# Patient Record
Sex: Female | Born: 1980
Health system: Southern US, Community
[De-identification: ages and names within clinical notes are randomized; demographics above are authoritative.]

## PROBLEM LIST (undated history)

## (undated) DIAGNOSIS — M797 Fibromyalgia: Secondary | ICD-10-CM

## (undated) DIAGNOSIS — M1990 Unspecified osteoarthritis, unspecified site: Secondary | ICD-10-CM

## (undated) DIAGNOSIS — F419 Anxiety disorder, unspecified: Secondary | ICD-10-CM

## (undated) DIAGNOSIS — D649 Anemia, unspecified: Secondary | ICD-10-CM

## (undated) DIAGNOSIS — R519 Headache, unspecified: Secondary | ICD-10-CM

## (undated) HISTORY — DX: Anemia, unspecified: D64.9

## (undated) HISTORY — PX: KNEE ARTHROSCOPY: SUR90

## (undated) HISTORY — DX: Unspecified osteoarthritis, unspecified site: M19.90

## (undated) HISTORY — DX: Headache, unspecified: R51.9

---
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
## 2008-04-28 HISTORY — PX: GASTRIC BYPASS: SHX52

## 2008-04-28 HISTORY — PX: CHOLECYSTECTOMY: SHX55

## 2015-06-19 ENCOUNTER — Encounter: Payer: Self-pay | Admitting: Psychiatry

## 2015-06-19 DIAGNOSIS — F22 Delusional disorders: Secondary | ICD-10-CM | POA: Diagnosis present
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
## 2015-06-19 NOTE — BH Assessment (Signed)
Patient has been accepted to [facility].  Patient was referred by [facility] [phone]) Accepting physician is Dr. Wynn Banker.  Attending Physician will be Dr. Jennet Maduro.  Patient has been assigned to room 315, by [facility] Charge Nurse [place].  Call report to [phone].  Representative/Transfer Coordinator is Judeth Cornfield  Patient expected to arrive Wednesday, 06/20/2015 after 7am

## 2015-06-20 ENCOUNTER — Inpatient Hospital Stay
Admission: EM | Admit: 2015-06-20 | Discharge: 2015-06-20 | DRG: 885 | Disposition: A | Discharge status: Home / Self Care | Payer: No Typology Code available for payment source | Source: Other Acute Inpatient Hospital | Attending: Psychiatry | Admitting: Psychiatry

## 2015-06-20 DIAGNOSIS — M797 Fibromyalgia: Secondary | ICD-10-CM | POA: Diagnosis present

## 2015-06-20 DIAGNOSIS — F909 Attention-deficit hyperactivity disorder, unspecified type: Secondary | ICD-10-CM | POA: Diagnosis present

## 2015-06-20 DIAGNOSIS — F22 Delusional disorders: Secondary | ICD-10-CM | POA: Diagnosis present

## 2015-06-20 DIAGNOSIS — F411 Generalized anxiety disorder: Secondary | ICD-10-CM | POA: Diagnosis present

## 2015-06-20 DIAGNOSIS — Z880 Allergy status to penicillin: Secondary | ICD-10-CM | POA: Diagnosis not present

## 2015-06-20 HISTORY — DX: Fibromyalgia: M79.7

## 2015-06-20 HISTORY — DX: Anxiety disorder, unspecified: F41.9

## 2015-06-20 LAB — URINALYSIS COMPLETE WITH MICROSCOPIC (ARMC ONLY)
BILIRUBIN URINE: NEGATIVE
Bacteria, UA: NONE SEEN
GLUCOSE, UA: NEGATIVE mg/dL
HGB URINE DIPSTICK: NEGATIVE
Leukocytes, UA: NEGATIVE
Nitrite: NEGATIVE
PH: 6 (ref 5.0–8.0)
PROTEIN: NEGATIVE mg/dL
SPECIFIC GRAVITY, URINE: 1.021 (ref 1.005–1.030)

## 2015-06-20 LAB — COMPREHENSIVE METABOLIC PANEL
ALBUMIN: 4.1 g/dL (ref 3.5–5.0)
ALT: 113 U/L — AB (ref 14–54)
AST: 56 U/L — AB (ref 15–41)
Alkaline Phosphatase: 123 U/L (ref 38–126)
Anion gap: 12 (ref 5–15)
BUN: 12 mg/dL (ref 6–20)
CHLORIDE: 109 mmol/L (ref 101–111)
CO2: 21 mmol/L — AB (ref 22–32)
CREATININE: 0.58 mg/dL (ref 0.44–1.00)
Calcium: 9.4 mg/dL (ref 8.9–10.3)
GFR calc Af Amer: >60 mL/min (ref >60)
GFR calc non Af Amer: >60 mL/min (ref >60)
Glucose, Bld: 104 mg/dL — ABNORMAL HIGH (ref 65–99)
POTASSIUM: 3.8 mmol/L (ref 3.5–5.1)
SODIUM: 142 mmol/L (ref 135–145)
Total Bilirubin: 0.6 mg/dL (ref 0.3–1.2)
Total Protein: 7.7 g/dL (ref 6.5–8.1)

## 2015-06-20 LAB — CBC
HCT: 37.4 % (ref 35.0–47.0)
Hemoglobin: 12.3 g/dL (ref 12.0–16.0)
MCH: 27 pg (ref 26.0–34.0)
MCHC: 32.9 g/dL (ref 32.0–36.0)
MCV: 82 fL (ref 80.0–100.0)
PLATELETS: 320 10*3/uL (ref 150–440)
RBC: 4.56 MIL/uL (ref 3.80–5.20)
RDW: 13.8 % (ref 11.5–14.5)
WBC: 9.2 10*3/uL (ref 3.6–11.0)

## 2015-06-20 LAB — LIPID PANEL
CHOL/HDL RATIO: 2.6 ratio
Cholesterol: 166 mg/dL (ref 0–200)
HDL: 65 mg/dL (ref >40)
LDL Cholesterol: 82 mg/dL (ref 0–99)
Triglycerides: 95 mg/dL (ref <150)
VLDL: 19 mg/dL (ref 0–40)

## 2015-06-20 LAB — HEMOGLOBIN A1C: HEMOGLOBIN A1C: 4.4 % (ref 4.0–6.0)

## 2015-06-20 LAB — TSH: TSH: 0.249 u[IU]/mL — AB (ref 0.350–4.500)

## 2015-06-20 MED ORDER — ACETAMINOPHEN 325 MG PO TABS: 650.0000 mg | ORAL_TABLET | Freq: Four times a day (QID) | ORAL | Status: DC | PRN

## 2015-06-20 MED ORDER — MAGNESIUM HYDROXIDE 400 MG/5ML PO SUSP: 30.0000 mL | Freq: Every day | ORAL | Status: DC | PRN

## 2015-06-20 MED ORDER — ARIPIPRAZOLE 10 MG PO TABS: 10.0000 mg | ORAL_TABLET | Freq: Every day | ORAL | Status: DC

## 2015-06-20 MED ORDER — ALUM & MAG HYDROXIDE-SIMETH 200-200-20 MG/5ML PO SUSP: 30.0000 mL | ORAL | Status: DC | PRN

## 2015-06-20 MED ORDER — NICOTINE 21 MG/24HR TD PT24: 21.0000 mg | MEDICATED_PATCH | Freq: Every day | TRANSDERMAL | Status: DC

## 2015-06-20 MED ORDER — TRAZODONE HCL 100 MG PO TABS: 100.0000 mg | ORAL_TABLET | Freq: Every evening | ORAL | Status: DC | PRN

## 2015-06-20 NOTE — Progress Notes (Signed)
Patient ID: Nicole Colon, female   DOB: Aug 25, 1980, 35 y.o.   MRN: [id number]  Pt admitted via sheriff from [facility] this am anxious/rapid speech/tearful during interaction, states that her husband IVC'd her, reports that she has not been feeling right with her medications, "I thought there were bugs on my skin and bed bugs in the bed and I couldn't stop thinking and worrying about it", denies SI/HI/AVH presently but states that "I did hallucinate one time recently because I thought my aunt was at my house and she wasn't", pt lives with husband and 2 children, recent stressor: job loss--since July due to fibromyalgia and not being able to work, 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

## 2015-06-20 NOTE — Tx Team (Signed)
Interdisciplinary Treatment Plan Update (Adult)        Date: 06/20/2015   Time Reviewed: 9:30 AM   Progress in Treatment: Improving  Attending groups: Intermittently Participating in groups: Intermittently Taking medication as prescribed: Yes  Tolerating medication: Yes  Family/Significant other contact made: Yes, CSW spoke with the pt's husband Patient understands diagnosis: Yes  Discussing patient identified problems/goals with staff: Yes  Medical problems stabilized or resolved: Yes  Denies suicidal/homicidal ideation: Yes  Issues/concerns per patient self-inventory: Yes  Other:   New problem(s) identified: N/A   Discharge Plan or Barriers: Pt plans to retun home to [place], [place]. and follow up with [facility] for her medical hospital follow up and with [facility] for medication management and therapy.  Reason for Continuation of Hospitalization:   Depression   Anxiety   Medication Stabilization   Comments: N/A   Estimated length of stay: 3-5 days    Patient is a 35 year old female with a history of anxiety, fibromyalgia, and new onset paranoid delusions.  History of present illness. Information was obtained from the patient and the chart. The patient has a long history of anxiety for which she has been prescribed Lexapro and ADHD for which she was taking Adderall. In December the dose of Adderall was increased from 20 to 30 mg and Wellbutrin was added to her regimen. The patient's did not notice any benefits. Moreover she feels that she became more anxious. 3 days ago she started experiencing new onset of paranoid delusions. She felt that there are parasites in her mouth, on her skin, on the carpet and tile at home and eventually on her children. Her husband took out a petition as he believe she became dysfunctional. The patient reports that for the past 3 months she had thrush in her mouth 5 times. She is under investigation for immune dysfunction that may  also cause fibromyalgia. She believes that this cohort in her mouth S to her unusual behavior. Before coming to the hospital at home she has been cleaning her apartment incessantly. She spent 2 days at the crisis center when her medications were discontinued. During the interview she denies any symptoms of depression, anxiety, or psychosis. She believes that her paranoia resolved completely and he has good insight into her problems. She believes that recent adjustments of medications would have contributed to her symptoms. She no longer wants to take Wellbutrin or stimulants. She has been frightened by unusual behaviors. She talked to her family including her husband since admission. She reports that everybody believes she is at baseline. She adamantly denies misusing her medications. She does not drink alcohol or use illicit drugs.  Past psychiatric history. She has never been hospitalized. There were no suicide attempts. She reports that she was bullied in school and received some psychotherapy then. She has been on medications for anxiety for the past 7 years after her husband was diagnosed with Asperger's, she developed fibromyalgia and had to quit her job. She reports that Lexapro has been very helpful in eliminating number of panic attacks. She denies any anxiety today but in the past she experienced panic attacks, generalized anxiety symptoms and some symptoms suggestive of OCD traits.  Family psychiatric history. Her 30 year old son may have a mild form of autism.  Social history. 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 Since she stopped working the family is in  financial difficulties. Dealing with her 53 year old son with possible autism has been very difficult. Also suffering from fibromyalgia as to her stress.  Patient lives in [place]. Patient will benefit from crisis  stabilization, medication evaluation, group therapy, and psycho education in addition to case management for discharge planning. Patient and CSW reviewed pt's identified goals and treatment plan. Pt verbalized understanding and agreed to treatment plan.       Review of initial/current patient goals per problem list:  1. Goal(s): Patient will participate in aftercare plan   Met: Yes  Target date: 3-5 days post admission date   As evidenced by: Patient will participate within aftercare plan AEB aftercare provider and housing plan at discharge being identified.   2/22: Pt plans to retun home to [place], [place]. and follow up with [facility] for her medical hospital follow up and with [facility] for medication management and therapy.     3. Goal(s): Patient will demonstrate decreased signs and symptoms of anxiety.   Met: Adequate for discharge per MD.  Target date: 3-5 days post admission date   As evidenced by: Patient will utilize self-rating of anxiety at 3 or below and demonstrated decreased signs of anxiety, or be deemed stable for discharge by MD   2/22: Adequate for discharge per MD.  Pt reports baseline symptoms of anxiety      5. Goal(s): Patient will demonstrate decreased signs of psychosis  * Met: Adequate for discharge per MD. * Target date: 3-5 days post admission date  * As evidenced by: Patient will demonstrate decreased frequency of AVH or return to baseline function   2/22: Adequate for discharge per MD.  Pt denies AVH

## 2015-06-20 NOTE — Progress Notes (Signed)
Recreation Therapy Notes  INPATIENT RECREATION THERAPY ASSESSMENT  Patient Details Name: Terrilyn Tyner MRN: [id number] DOB: 1980-12-09 Today's Date: 06/20/2015  Patient Stressors: Work, Other (Comment) (Hasn't worked since July was the "bread winner"; diagnoses with fibromyalgia; husband has to work more to make up for her not working - with her children all the time)  Coping Skills:   Exercise, Art/Dance, Talking, Music, Sports, Other (Comment) (Deep breathing, counting)  Personal Challenges: Communication, Concentration, Stress Management, Time Management  Leisure Interests (2+):  Individual - Other (Comment) (Go to the park with children, craft)  Awareness of Community Resources:  Yes  Community Resources:  Park  Current Use: Yes  If no, Barriers?:    Patient Strengths:  Personality, good mom  Patient Identified Areas of Improvement:  Time management, stress management  Current Recreation Participation:  Went on date with husband, spend time with family  Patient Goal for Hospitalization:  To get better and get back to family  [place] of Residence:  [place] of Residence:  [place]   Current [place] (including self-harm):  No  Current HI:  No  Consent to Intern Participation: N/A   Jacquelynn Cree, LRT/CTRS 06/20/2015, 4:40 PM

## 2015-06-20 NOTE — Tx Team (Signed)
Initial Interdisciplinary Treatment Plan   PATIENT STRESSORS: Health problems Marital or family conflict Medication change or noncompliance Occupational concerns   PATIENT STRENGTHS: Average or above average intelligence Capable of independent living General fund of knowledge Motivation for treatment/growth Supportive family/friends Work skills   PROBLEM LIST: Problem List/Patient Goals Date to be addressed Date deferred Reason deferred Estimated date of resolution  Obsessions      Psychosis      Anxiety      Medication Management--recent loss of insurance coverage                                     DISCHARGE CRITERIA:  Improved stabilization in mood, thinking, and/or behavior Motivation to continue treatment in a less acute level of care Reduction of life-threatening or endangering symptoms to within safe limits Verbal commitment to aftercare and medication compliance  PRELIMINARY DISCHARGE PLAN: Attend aftercare/continuing care group Outpatient therapy Participate in family therapy Return to previous living arrangement  PATIENT/FAMIILY INVOLVEMENT: This treatment plan has been presented to and reviewed with the patient, Nicole Colon.  The patient and family have been given the opportunity to ask questions and make suggestions.  Lauris Poag 06/20/2015, 2:47 PM

## 2015-06-20 NOTE — [facility] Suicide Risk Assessment (Signed)
[facility] INPATIENT:  Family/Significant Other Suicide Prevention Education  Suicide Prevention Education:  Education Completed; Pt's husband Israel Wunder at ph: [phone],  (name of family member/significant other) has been identified by the patient as the family member/significant other with whom the patient will be residing, and identified as the person(s) who will aid the patient in the event of a mental health crisis (suicidal ideations/suicide attempt).  With written consent from the patient, the family member/significant other has been provided the following suicide prevention education, prior to the and/or following the discharge of the patient.  CSW completed SPE with pt.  The suicide prevention education provided includes the following:  Suicide risk factors  Suicide prevention and interventions  National Suicide Hotline telephone number  [facility] assessment telephone number  [facility] Emergency Assistance 911  [facility] and/or Residential Mobile Crisis Unit telephone number  Request made of family/significant other to:  Remove weapons (e.g., guns, rifles, knives), all items previously/currently identified as safety concern.    Remove drugs/medications (over-the-counter, prescriptions, illicit drugs), all items previously/currently identified as a safety concern.  The family member/significant other verbalizes understanding of the suicide prevention education information provided.  The family member/significant other agrees to remove the items of safety concern listed above.  Nicole Colon 06/20/2015, 12:20 PM and Patient Refusal for Family/Significant Other Suicide Prevention Education: The patient Nicole Colon has refused to provide written consent for family/significant other to be provided Family/Significant Other Suicide Prevention Education during admission and/or prior to discharge.  Physician notified.  Nicole Pea  Nicole Colon 06/20/2015, 12:20 PM

## 2015-06-20 NOTE — BHH Counselor (Signed)
Adult Comprehensive Assessment  Patient ID: Nicole Colon, female   DOB: [birth year], 35 y.o.   MRN: [id number]  Information Source: Information source: Patient  Current Stressors:  Educational / Learning stressors: N/A Employment / Job issues: Pt can't physically work due to fibromyalgia Family Relationships: Pt's husband's family have numerous arguments Surveyor, quantity / Lack of resources (include bankruptcy): Financial worries, pt was primary money-winner, but now the husband is. Housing / Lack of housing: Pt is unsure of the security pf her present housing situation Physical health (include injuries & life threatening diseases): Fibromyalgia Social relationships: N/A Substance abuse: Pt denies Bereavement / Loss: N/A  Living/Environment/Situation:  Living Arrangements: Spouse/significant other What is atmosphere in current home: Chaotic  Family History:  Marital status: Married Number of Years Married: 11 What types of issues is patient dealing with in the relationship?: Pt trust has been broken due to the pt's husband committing her to the hospital and the pt's husband has a diagnosis of Asbergers. Does patient have children?: Yes How many children?: 2 How is patient's relationship with their children?: Good relationship with children  Childhood History:  By whom was/is the patient raised?: Mother, Other (Comment) (Mother and step-father) Additional childhood history information: Good childhood.  Pt's father left her home at an eary age and pt experienced some angst over this Description of patient's relationship with caregiver when they were a child: Good Patient's description of current relationship with people who raised him/her: Good Does patient have siblings?: Yes Number of Siblings: 4 Description of patient's current relationship with siblings: Good with some and not good with others Did patient suffer any verbal/emotional/physical/sexual abuse as a child?: No Did  patient suffer from severe childhood neglect?: No Has patient ever been sexually abused/assaulted/raped as an adolescent or adult?: No Was the patient ever a victim of a crime or a disaster?: No Witnessed domestic violence?: No Has patient been effected by domestic violence as an adult?: No  Education:  Highest grade of school patient has completed: 12th grade, has 2 years of college Name of school: n/a Contact person: n/a Learning disability?: No  Employment/Work Situation:   Employment situation:  (Applying for disability) What is the longest time patient has a held a job?: 10 years Where was the patient employed at that time?: [employer] Has patient ever been in the [employer]?: No Has patient ever served in Buyer, retail?: No  Financial Resources:   Surveyor, quantity resources: Support from parents / caregiver Does patient have a Lawyer or guardian?: No  Alcohol/Substance Abuse:   What has been your use of drugs/alcohol within the last 12 months?: Pt reports she only drinks one or two drinks twice a year.  Pt denies the use of any other substance except for taking a lorezepam once earlier this years in the ED If attempted suicide, did drugs/alcohol play a role in this?: No Alcohol/Substance Abuse Treatment Hx: Denies past history Has alcohol/substance abuse ever caused legal problems?: No  Social Support System:   Describe Community Support System: Husband, mother, Celine Ahr, sisters and a fellow church member Type of faith/religion: [employer] How does patient's faith help to cope with current illness?: Prayer   Leisure/Recreation:   Leisure and Hobbies: Pt scrapbooks, read the bible and spend time with other girls, picnicking  Strengths/Needs:   What things does the patient do well?: Good wife, daughter and mother In what areas does patient struggle / problems for patient: Cleaning is tough with her family  Discharge Plan:   Does patient have access  to transportation?: Yes Will  patient be returning to same living situation after discharge?:  (Pt is unsure) Currently receiving community mental health services: No If no, would patient like referral for services when discharged?: Yes (What county?) [facility] Does patient have financial barriers related to discharge medications?: Yes Patient description of barriers related to discharge medications: Pt has no insurance  Summary/Recommendations:   Summary and Recommendations (to be completed by the evaluator): Patient presented to the hospital under IVC and was admitted for experiencing hallucinations.  Pt's primary complaint is experiencing visual hallucinations.  Pt reports primary triggers for admission was the pt.'s visual hallucinations.  Pt reports her stressors are she her financial worries, her physical difficulties and her family relationships.  Pt now denies SI/HI/AVH.  Patient lives in [place], [place].  Pt lists supports in the community as her family, a fellow church member and her mother.  Patient will benefit from crisis stabilization, medication evaluation, group therapy, and psycho education in addition to case management for discharge planning. Patient and CSW reviewed pt.'s identified goals and treatment plan. Pt verbalized understanding and agreed to treatment plan.  At discharge it is recommended that patient remain compliant with established plan and continue treatment.  Dorothe Pea Maryela Tapper. 06/20/2015

## 2015-06-20 NOTE — B[facility] Group Notes (Signed)
[facility] Group Notes:  (Nursing/MHT/Case Management/Adjunct)  Date:  06/20/2015  Time:  2:24 PM  Type of Therapy:  Psychoeducational Skills  Participation Level:  Active  Participation Quality:  Appropriate  Affect:  Appropriate  Cognitive:  Appropriate  Insight:  Appropriate  Engagement in Group:  Engaged  Modes of Intervention:  Discussion, Education and Support  Summary of Progress/Problems:  Nicole Colon 06/20/2015, 2:24 PM

## 2015-06-20 NOTE — BHH Suicide Risk Assessment (Signed)
[facility] Discharge Suicide Risk Assessment   Principal Problem: Delusional disorder, somatic type, first episode, currently in acute episode [facility]) Discharge Diagnoses:  Patient Active Problem List   Diagnosis Date Noted  . Attention deficit hyperactivity disorder (ADHD) [F90.9] 06/20/2015  . Fibromyalgia [M79.7] 06/20/2015  . Generalized anxiety disorder [F41.1] 06/20/2015  . Delusional disorder, somatic type, first episode, currently in acute episode (HCC) [F22] 06/19/2015    Total Time spent with patient: 1 hour  Musculoskeletal: Strength & Muscle Tone: within normal limits Gait & Station: normal Patient leans: N/A  Psychiatric Specialty Exam: Review of Systems  Musculoskeletal: Positive for myalgias.  All other systems reviewed and are negative.   Blood pressure 135/78, pulse 98, temperature 98 F (36.7 C), temperature source Oral, resp. rate 20, height  (1.676 m), weight 97.977 kg (216 lb), SpO2 98 %.Body mass index is 34.88 kg/(m^2).  General Appearance: Casual  Eye Contact::  Good  Speech:  Clear and Coherent409  Volume:  Normal  Mood:  Euthymic  Affect:  Appropriate  Thought Process:  Goal Directed  Orientation:  Full (Time, Place, and Person)  Thought Content:  WDL  Suicidal Thoughts:  No  Homicidal Thoughts:  No  Memory:  Immediate;   Fair Recent;   Fair Remote;   Fair  Judgement:  Fair  Insight:  Fair  Psychomotor Activity:  Normal  Concentration:  Fair  Recall:  [employer] of Knowledge:Fair  Language: Fair  Akathisia:  No  Handed:  Right  AIMS (if indicated):     Assets:  Communication Skills Desire for Improvement Financial Resources/Insurance Housing Resilience Social Support  Sleep:     Cognition: WNL  ADL's:  Intact   Mental Status Per Nursing Assessment::   On Admission:     Demographic Factors:  Caucasian, Low socioeconomic status and Unemployed  Loss Factors: Decrease in vocational status, Decline in physical health and Financial  problems/change in socioeconomic status  Historical Factors: Impulsivity  Risk Reduction Factors:   Responsible for children under 80 years of age, Sense of responsibility to family, Living with another person, especially a relative, Positive social support and Positive therapeutic relationship  Continued Clinical Symptoms:  Severe Anxiety and/or Agitation  Cognitive Features That Contribute To Risk:  None    Suicide Risk:  Minimal: No identifiable suicidal ideation.  Patients presenting with no risk factors but with morbid ruminations; may be classified as minimal risk based on the severity of the depressive symptoms  Follow-up Information    Follow up with [facility].   Why:  Please arrive to the walk-in clinic on Mon and Thurs from 8am-5pm, Tuesday and Wednesday  from 8am-3pm and on Friday from 8am-1pm for your assessment for medication managment and therapy   Contact information:   [address] [place], [place] [address] [phone] Fax: [phone]      Follow up with [facility].   Why:  Please arrive for your medical hospital follow up appointment with Dr. Larey Seat on Monday February 27th at [facility] information:   [address] [place], [place] [address] Phone: [phone] Fax: [phone]      Plan Of CarAs tolerated.ollow-up recommendations:  Activity:  As tolerated. Diet:  Low sodium heart healthy. Other:  Keep follow-up appointments.  Kristine Linea, MD 06/20/2015, 3:44 PM

## 2015-06-20 NOTE — [facility] Group Notes (Signed)
[facility] LCSW Group Therapy  06/20/2015 2:43 PM  Type of Therapy:  Group Therapy  Participation Level:  Did Not Attend  Summary of Progress/Problems: Patient was called to group but did not attend.    Terrika Zuver T, MSW, LCSWA 06/20/2015, 2:43 PM  

## 2015-06-20 NOTE — B[facility] Group Notes (Signed)
[facility] LCSW Aftercare Discharge Planning Group Note   06/20/2015 11:24 AM  Participation Quality:  Patient arrived 30 minutes late for group but participated introducing herself and sharing her SMART goal is to "get hallucinations under control and be with my babies".   Mood/Affect:  Appropriate  Depression Rating:  0  Anxiety Rating:  4  Thoughts of Suicide:  No Will you contract for safety?   NA  Current AVH:  Yes  Plan for Discharge/Comments:  Discharge home with family and follow up outpatient  Transportation Means: family  Supports: family   Lulu Riding, MSW, LCSWA

## 2015-06-20 NOTE — Progress Notes (Signed)
  [facility] Adult Case Management Discharge Plan :  Will you be returning to the same living situation after discharge:  No. Pt will be returning to [place], but will be living with her mother At discharge, do you have transportation home?: Yes,  pt will be picked up by her mother Do you have the ability to pay for your medications: Yes,  pt will be provided with prescriptions at discharge  Release of information consent forms completed and in the chart;  Patient's signature needed at discharge.  Patient to Follow up at: Follow-up Information    Follow up with [facility].   Why:  Please arrive to the walk-in clinic on Mon and Thurs from 8am-5pm, Tuesday and Wednesday  from 8am-3pm and on Friday from 8am-1pm for your assessment for medication managment and therapy   Contact information:   [address] [place], [place] [address] [phone] Fax: [phone]      Follow up with [facility].   Why:  Please arrive for your medical hospital follow up appointment with Dr. Larey Seat on Monday February 27th at [facility] information:   [address] [place], [place] [address] Phone: [phone] Fax: [phone]      Next level of care provider has access to [facility] Link:no  Safety Planning and Suicide Prevention discussed: Yes,  completed with the pt's husband and with the pt  Have you used any form of tobacco in the last 30 days? (Cigarettes, Smokeless Tobacco, Cigars, and/or Pipes): No  Has patient been referred to the Quitline?: N/A patient is not a smoker  Patient has been referred for addiction treatment: N/A  Mercy Riding 06/20/2015, 3:31 PM

## 2015-06-20 NOTE — Progress Notes (Signed)
D/C instructions/transition record/suicide risk assessmenr/meds/follow-up appointments reviewed, pt verbalized understanding, pt's belongings returned to pt, mother to take patient and pt to stay at her house until she is in contact with CPS and gets the go ahead to go back home, denies SI/HI/AVH.

## 2015-06-20 NOTE — Plan of Care (Signed)
Problem: [facility] Participation in Recreation Therapeutic Interventions Goal: STG-Other Recreation Therapy Goal (Specify) STG: Time Management - Within 3 treatment sessions, patient will verbalize understanding of scheduling in one treatment session to increase time management skills post d/c.  Outcome: Completed/Met Date Met:  06/20/15 Treatment Session 1; Completed 1 out of 1: At approximately 4:05 pm, LRT met with patient in patient room. LRT educated and provided patient with blank schedules to help with time management. Patient verbalized understanding of scheduling. LRT encouraged patient to use the schedules to help her manage her time better. Intervention Used: Schedules  Leonette Monarch, LRT/CTRS 02.22.17 4:44 pm  Problem: [facility] Participation in Recreation Therapeutic Interventions Goal: STG-Other Recreation Therapy Goal (Specify) STG: Stress Management - Within 3 treatment sessions, patient will verbalize understanding of the stress management techniques in one treatment session to increase stress management skills post d/c.  Outcome: Completed/Met Date Met:  06/20/15 Treatment Session 1; Completed 1 out of 1: At approximately 4:05 pm, LRT met with patient in patient room. LRT educated and provided patient with handouts on stress management techniques. Patient verbalized understanding. LRT encouraged patient to read over and practice the techniques. Intervention Used: Stress Management handouts  Leonette Monarch, LRT/CTRS 02.22.17 4:45 pm

## 2015-06-20 NOTE — Progress Notes (Signed)
Recreation Therapy Notes  Date: 02.22.17 Time: 3:00 pm Location: Community Room   Group Topic: Self-esteem, Coping skills  Goal Area(s) Addresses:  Patient will identify positive traits about self. Patient will identify at least one coping skill.  Behavioral Response: Did not attend  Intervention: All About Me  Activity: Patients were instructed to make an All About Me pamphlet including their life's motto, positive traits about themselves, healthy coping skills, and their support system.  Education: LRT educated patients on ways they can increase their self-esteem.  Education Outcome: Patient did not attend group.  Clinical Observations/Feedback: Patient did not attend group.  Jacquelynn Cree, LRT/CTRS 06/20/2015 4:19 PM

## 2015-06-20 NOTE — H&P (Signed)
Psychiatric Admission Assessment Adult  Patient Identification: Nicole Colon Colon MRN:  [id number] Date of Evaluation:  06/20/2015 Chief Complaint:  Depression  Principal Diagnosis: Delusional disorder, somatic type, first episode, currently in acute episode [facility]) Diagnosis:   Patient Active Problem List   Diagnosis Date Noted  . Attention deficit hyperactivity disorder (ADHD) [F90.9] 06/20/2015  . Fibromyalgia [M79.7] 06/20/2015  . Generalized anxiety disorder [F41.1] 06/20/2015  . Delusional disorder, somatic type, first episode, currently in acute episode ([place]) [F22] 06/19/2015   History of Present Illness:  Identifying data. Nicole Colon Colon is a 35 year old female with a history of anxiety, fibromyalgia, and new onset paranoid delusions.  Chief complaint. "I lose a little better now."  History of present illness. Information was obtained from the patient and the chart. The patient has a long history of anxiety for which she has been prescribed Lexapro and ADHD for which she was taking Adderall. In December the dose of Adderall was increased from 20 to 30 mg and Wellbutrin was added to her regimen. The patient's did not notice any benefits. Moreover she feels that she became more anxious. 3 days ago she started experiencing new onset of paranoid delusions. She felt that there are parasites in her mouth, on her skin, on the carpet and tile at home and eventually on her children. Her husband took out a petition as he believe she became dysfunctional. The patient reports that for the past 3 months she had thrush in her mouth 5 times. She is under investigation for immune dysfunction that may also cause fibromyalgia. She believes that this cohort in her mouth S to her unusual behavior. Before coming to the hospital at home she has been cleaning her apartment incessantly. She spent 2 days at the crisis center when her medications were discontinued. During the interview she denies any symptoms of  depression, anxiety, or psychosis. She believes that her paranoia resolved completely and he has good insight into her problems. She believes that recent adjustments of medications would have contributed to her symptoms. She no longer wants to take Wellbutrin or stimulants. She has been frightened by unusual behaviors. She talked to her family including her husband since admission. She reports that everybody believes she is at baseline. She adamantly denies misusing her medications. She does not drink alcohol or use illicit drugs.  Past psychiatric history. She has never been hospitalized. There were no suicide attempts. She reports that she was bullied in school and received some psychotherapy then. She has been on medications for anxiety for the past 7 years after her husband was diagnosed with Asperger's, she developed fibromyalgia and had to quit her job. She reports that Lexapro has been very helpful in eliminating number of panic attacks. She denies any anxiety today but in the past she experienced panic attacks, generalized anxiety symptoms and some symptoms suggestive of OCD traits.  Family psychiatric history. Her 16 year old son may have a mild form of autism.  Social history. 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 Since she stopped working the family is in financial difficulties. Dealing with her 55 year old son with possible autism has been very difficult. Also suffering from fibromyalgia as to her stress.  Total Time spent with patient: 1 hour  Past Psychiatric History: Anxiety.  Is the patient at risk to self? No.  Has the patient been a risk to self in the past 6 months? No.  Has the patient been a risk to self within the distant past? No.  Is the patient a risk to others? No.  Has the patient been a risk to others in the past 6 months? No.  Has the patient been a  risk to others within the distant past? No.   Prior Inpatient Therapy: Prior Inpatient Therapy: No Prior Therapy Dates: Patient didn't say Prior Therapy Facilty/Provider(s): Patient didn't say3 Reason for Treatment: Patient didn't say Prior Outpatient Therapy: Prior Outpatient Therapy: Yes Prior Therapy Dates: Currently Prior Therapy Facilty/Provider(s): Nicole Colon Colon, [place] (Primary Care Physician) Reason for Treatment: Mood Disorder Does patient have an ACCT team?: No Does patient have Intensive In-House Services?  : No Does patient have [facility] services? : No Does patient have P4CC services?: No  Alcohol Screening: 1. How often do you have a drink containing alcohol?: Never 9. Have you or someone else been injured as a result of your drinking?: No 10. Has a relative or friend or a doctor or another health worker been concerned about your drinking or suggested you cut down?: No Alcohol Use Disorder Identification Test Final Score (AUDIT): 0 Brief Intervention: AUDIT score less than 7 or less-screening does not suggest unhealthy drinking-brief intervention not indicated Substance Abuse History in the last 12 months:  No. Consequences of Substance Abuse: NA Previous Psychotropic Medications: Yes  Psychological Evaluations: No  Past Medical History:  Past Medical History  Diagnosis Date  . Fibromyalgia   . Anxiety    History reviewed. No pertinent past surgical history. Family History: History reviewed. No pertinent family history. Family Psychiatric  History: Child with autism. Tobacco Screening: _0 [phone])::1)@ Social History:  History  Alcohol Use No     History  Drug Use No    Additional Social History: Marital status: Married Number of Years Married: 51 What types of issues is patient dealing with in the relationship?: Pt trust has been broken due to the pt's husband committing her to the hospital and the pt's husband has a diagnosis of  Asbergers. Does patient have children?: Yes How many children?: 2 How is patient's relationship with their children?: Good relationship with children    Pain Medications: See PTA Prescriptions: See PTA Over the Counter: See PTA History of alcohol / drug use?: No history of alcohol / drug abuse Longest period of sobriety (when/how long): Per husband, she drank alcohol when she was in college3 Negative Consequences of Use:  (None Reported) Withdrawal Symptoms:  (None Reported)                    Allergies:   Allergies  Allergen Reactions  . Amoxicillin   . Orange (Diagnostic)     Oranges/orange dye   Lab Results:  Results for orders placed or performed during the hospital encounter of 06/20/15 (from the past 48 hour(s))  Urinalysis complete, with microscopic ([facility] only)     Status: Abnormal   Collection Time: 06/20/15  8:56 AM  Result Value Ref Range   Color, Urine YELLOW (A) YELLOW   APPearance CLEAR (A) CLEAR   Glucose, UA NEGATIVE NEGATIVE mg/dL   Bilirubin Urine NEGATIVE NEGATIVE   Ketones, ur 2+ (A) NEGATIVE mg/dL   Specific Gravity, Urine 1.021 1.005 - 1.030   Hgb urine dipstick NEGATIVE NEGATIVE   pH 6.0 5.0 - 8.0   Protein, ur NEGATIVE NEGATIVE mg/dL   Nitrite NEGATIVE NEGATIVE   Leukocytes, UA NEGATIVE NEGATIVE   RBC / HPF 0-5 0 - 5 RBC/hpf  WBC, UA 0-5 0 - 5 WBC/hpf   Bacteria, UA NONE SEEN NONE SEEN   Squamous Epithelial / LPF 0-5 (A) NONE SEEN   Mucous PRESENT    Hyaline Casts, UA PRESENT   CBC     Status: None   Collection Time: 06/20/15  9:22 AM  Result Value Ref Range   WBC 9.2 3.6 - 11.0 K/uL   RBC 4.56 3.80 - 5.20 MIL/uL   Hemoglobin 12.3 12.0 - 16.0 g/dL   HCT 37.4 35.0 - 47.0 %   MCV 82.0 80.0 - 100.0 fL   MCH 27.0 26.0 - 34.0 pg   MCHC 32.9 32.0 - 36.0 g/dL   RDW 13.8 11.5 - 14.5 %   Platelets 320 150 - 440 K/uL  Comprehensive metabolic panel     Status: Abnormal   Collection Time: 06/20/15  9:22 AM  Result Value Ref Range   Sodium  142 135 - 145 mmol/L   Potassium 3.8 3.5 - 5.1 mmol/L   Chloride 109 101 - 111 mmol/L   CO2 21 (L) 22 - 32 mmol/L   Glucose, Bld 104 (H) 65 - 99 mg/dL   BUN 12 6 - 20 mg/dL   Creatinine, Ser 0.58 0.44 - 1.00 mg/dL   Calcium 9.4 8.9 - 10.3 mg/dL   Total Protein 7.7 6.5 - 8.1 g/dL   Albumin 4.1 3.5 - 5.0 g/dL   AST 56 (H) 15 - 41 U/L   ALT 113 (H) 14 - 54 U/L   Alkaline Phosphatase 123 38 - 126 U/L   Total Bilirubin 0.6 0.3 - 1.2 mg/dL   GFR calc non Af Amer >60 >60 mL/min   GFR calc Af Amer >60 >60 mL/min    Comment: (NOTE) The eGFR has been calculated using the CKD EPI equation. This calculation has not been validated in all clinical situations. eGFR's persistently <60 mL/min signify possible Chronic Kidney Disease.    Anion gap 12 5 - 15  Hemoglobin A1c     Status: None   Collection Time: 06/20/15  9:22 AM  Result Value Ref Range   Hgb A1c MFr Bld 4.4 4.0 - 6.0 %  Lipid panel, fasting     Status: None   Collection Time: 06/20/15  9:22 AM  Result Value Ref Range   Cholesterol 166 0 - 200 mg/dL   Triglycerides 95 <150 mg/dL   HDL 65 >40 mg/dL   Total CHOL/HDL Ratio 2.6 RATIO   VLDL 19 0 - 40 mg/dL   LDL Cholesterol 82 0 - 99 mg/dL    Comment:        Total Cholesterol/HDL:CHD Risk Coronary Heart Disease Risk Table                     Men   Women  1/2 Average Risk   3.4   3.3  Average Risk       5.0   4.4  2 X Average Risk   9.6   7.1  3 X Average Risk  23.4   11.0        Use the calculated Patient Ratio above and the CHD Risk Table to determine the patient's CHD Risk.        ATP III CLASSIFICATION (LDL):  <100     mg/dL   Optimal  100-129  mg/dL   Near or Above                    Optimal  130-159  mg/dL   Borderline  160-189  mg/dL   High  >190     mg/dL   Very High   TSH     Status: Abnormal   Collection Time: 06/20/15  9:22 AM  Result Value Ref Range   TSH 0.249 (L) 0.350 - 4.500 uIU/mL    Blood Alcohol level:  No results found for: [facility]  Metabolic  Disorder Labs:  Lab Results  Component Value Date   HGBA1C 4.4 06/20/2015   No results found for: PROLACTIN Lab Results  Component Value Date   CHOL 166 06/20/2015   TRIG 95 06/20/2015   HDL 65 06/20/2015   CHOLHDL 2.6 06/20/2015   VLDL 19 06/20/2015   LDLCALC 82 06/20/2015    Current Medications: Current Facility-Administered Medications  Medication Dose Route Frequency Provider Last Rate Last Dose  . acetaminophen (TYLENOL) tablet 650 mg  650 mg Oral Q6H PRN Kyrus Hyde B Azaylia Fong, MD      . alum & mag hydroxide-simeth (MAALOX/MYLANTA) 200-200-20 MG/5ML suspension 30 mL  30 mL Oral Q4H PRN Cullen Vanallen B Olumide Dolinger, MD      . ARIPiprazole (ABILIFY) tablet 10 mg  10 mg Oral Daily Azhia Siefken B Jes Costales, MD   10 mg at 06/20/15 1051  . magnesium hydroxide (MILK OF MAGNESIA) suspension 30 mL  30 mL Oral Daily PRN Elvis Laufer B Lyrical Sowle, MD      . traZODone (DESYREL) tablet 100 mg  100 mg Oral QHS PRN Baudelio Karnes B Larken Urias, MD       PTA Medications: Prescriptions prior to admission  Medication Sig Dispense Refill Last Dose  . amphetamine-dextroamphetamine (ADDERALL) 30 MG tablet Take 1 tablet by mouth 2 (two) times daily.  0 Past Week at Unknown time  . baclofen (LIORESAL) 20 MG tablet Take 1 tablet by mouth 3 (three) times daily as needed.  1 Past Week at Unknown time  . buPROPion (WELLBUTRIN) 100 MG tablet Take 1 tablet by mouth 2 (two) times daily.  1 Past Week at Unknown time  . escitalopram (LEXAPRO) 20 MG tablet Take 1 tablet by mouth daily.  2 Past Week at Unknown time  . LYRICA 150 MG capsule Take 1 capsule by mouth 2 (two) times daily.  1 Past Week at Unknown time  . promethazine (PHENERGAN) 25 MG tablet Take 1 tablet by mouth every 6 (six) hours as needed.  2 Past Week at Unknown time  . traMADol (ULTRAM) 50 MG tablet Take 1-2 tablets by mouth every 6 (six) hours as needed.  1 Past Week at Unknown time    Musculoskeletal: Strength & Muscle Tone: within normal limits Gait & Station:  normal Patient leans: N/A  Psychiatric Specialty Exam: Physical Exam  Nursing note and vitals reviewed. Constitutional: She is oriented to person, place, and time. She appears well-developed and well-nourished.  HENT:  Head: Normocephalic and atraumatic.  Eyes: Conjunctivae and EOM are normal. Pupils are equal, round, and reactive to light.  Neck: Normal range of motion. Neck supple.  Cardiovascular: Normal rate, regular rhythm and normal heart sounds.   Respiratory: Effort normal and breath sounds normal.  GI: Soft. Bowel sounds are normal.  Musculoskeletal: Normal range of motion.  Neurological: She is alert and oriented to person, place, and time.  Skin: Skin is warm and dry.    Review of Systems  Musculoskeletal: Positive for myalgias.  Skin: Positive for rash.  Psychiatric/Behavioral: The patient is nervous/anxious.   All other systems reviewed and are negative.   Blood pressure 135/78, pulse 98,  temperature 98 F (36.7 C), temperature source Oral, resp. rate 20, height _0  (1.676 m), weight 97.977 kg (216 lb), SpO2 98 %.Body mass index is 34.88 kg/(m^2).  See SRA.                                                  Sleep:        Treatment Plan Summary: Daily contact with patient to assess and evaluate symptoms and progress in treatment and Medication management   Ms. Penza is a 35 year old female with a history of anxiety, ADHD, and fibromyalgia admitted for delusional thinking.  1. Paranoid delusions. Resolved. The patient is not interested in starting an antipsychotic.  2. Mood and anxiety. The patient has been maintained on Wellbutrin and Lexapro in the community. She believes that Wellbutrin and Adderall could have contributed to her problems. We will discontinue Wellbutrin.  3. ADHD. The patient has been off her stimulant for several days now. She does not wish to restart it.   4. Fibromyalgia. She has been maintained on Lyrica and, as  needed, tramadol and baclofen.   5. Disposition. She was discharged to home with family. She will follow up with her psychiatrist.  Observation Level/Precautions:  15 minute checks  Laboratory:  CBC Chemistry Profile UDS UA  Psychotherapy:    Medications:    Consultations:    Discharge Concerns:    Estimated LOS:  Other:     I certify that inpatient services furnished can reasonably be expected to improve the patient's condition.    Orson Slick, MD 2/22/20172:37 PM

## 2015-06-20 NOTE — [facility] Suicide Risk Assessment (Signed)
[facility] Admission Suicide Risk Assessment   Nursing information obtained from:    Demographic factors:    Current Mental Status:    Loss Factors:    Historical Factors:    Risk Reduction Factors:     Total Time spent with patient: 1 hour Principal Problem: Delusional disorder, somatic type, first episode, currently in acute episode [facility]) Diagnosis:   Patient Active Problem List   Diagnosis Date Noted  . Delusional disorder, somatic type, first episode, currently in acute episode ([facility]) [F22] 06/19/2015   Subjective Data: Paranoid delusions.  Continued Clinical Symptoms:    The "Alcohol Use Disorders Identification Test", Guidelines for Use in Primary Care, Second Edition.  World Science writer [facility]). Score between 0-7:  no or low risk or alcohol related problems. Score between 8-15:  moderate risk of alcohol related problems. Score between 16-19:  high risk of alcohol related problems. Score 20 or above:  warrants further diagnostic evaluation for alcohol dependence and treatment.   CLINICAL FACTORS:   Currently Psychotic   Musculoskeletal: Strength & Muscle Tone: within normal limits Gait & Station: normal Patient leans: N/A  Psychiatric Specialty Exam: Review of Systems  Musculoskeletal: Positive for myalgias.  Skin: Positive for rash.  All other systems reviewed and are negative.   Blood pressure 135/78, pulse 98, temperature 98 F (36.7 C), temperature source Oral, resp. rate 20, height  (1.676 m), weight 97.977 kg (216 lb), SpO2 98 %.Body mass index is 34.88 kg/(m^2).  General Appearance: Fairly Groomed  Patent attorney::  Fair  Speech:  Clear and Coherent  Volume:  Normal  Mood:  Anxious  Affect:  Congruent  Thought Process:  Goal Directed  Orientation:  Full (Time, Place, and Person)  Thought Content:  Delusions and Paranoid Ideation  Suicidal Thoughts:  No  Homicidal Thoughts:  No  Memory:  Immediate;   Fair Recent;   Fair Remote;   Fair  Judgement:   Poor  Insight:  Lacking  Psychomotor Activity:  Normal  Concentration:  Fair  Recall:  [employer] of Knowledge:Fair  Language: Fair  Akathisia:  No  Handed:  Right  AIMS (if indicated):     Assets:  Communication Skills Desire for Improvement Physical Health Resilience Social Support  Sleep:     Cognition: WNL  ADL's:  Intact    COGNITIVE FEATURES THAT CONTRIBUTE TO RISK:  None    SUICIDE RISK:   Moderate:  Frequent suicidal ideation with limited intensity, and duration, some specificity in terms of plans, no associated intent, good self-control, limited dysphoria/symptomatology, some risk factors present, and identifiable protective factors, including available and accessible social support.  PLAN OF CARE: Hospital admission, medication management, discharge planning.  Nicole Colon is a 35 year old female with a history of anxiety, ADHD, and fibromyalgia admitted for delusional thinking.  1. Paranoid delusions. Resolved. The patient is not interested in starting an antipsychotic.  2. Mood and anxiety. The patient has been maintained on Wellbutrin and Lexapro in the community. She believes that her problems started when Wellbutrin was added to her regimen. We will discontinue Wellbutrin.  3. ADHD. The patient has been off her stimulant for several days now. She does not wish to restart it.   4. FibromyChronic pain. She has been maintained on Lyrica and, as needed, tramadol and baclofen.   5. Disposition. She was discharged to home with family. She will follow up with her psychiatrist.   I certify that inpatient services furnished can reasonably be expected to improve the  patient's condition.   Kristine Linea, MD 06/20/2015, 12:26 PM

## 2015-06-21 LAB — PROLACTIN: PROLACTIN: 6.6 ng/mL (ref 4.8–23.3)

## 2015-06-21 NOTE — Progress Notes (Signed)
Recreation Therapy Notes  INPATIENT RECREATION TR PLAN  Patient Details Name: Abria Vannostrand MRN: [id number] DOB: 11/13/1980 Today's Date: 06/21/2015  Rec Therapy Plan Is patient appropriate for Therapeutic Recreation?: Yes Treatment times per week: At least once a week TR Treatment/Interventions: 1:1 session, Group participation (Comment) (Appropriate participation in daily recreation therapy tx)  Discharge Criteria Pt will be discharged from therapy if:: Treatment goals are met, Discharged Treatment plan/goals/alternatives discussed and agreed upon by:: Patient/family  Discharge Summary Short term goals set: See Care Plan Short term goals met: Complete Progress toward goals comments: One-to-one attended One-to-one attended: Stress management, time management Reason goals not met: N/A Therapeutic equipment acquired: None Reason patient discharged from therapy: Discharge from hospital Pt/family agrees with progress & goals achieved: Yes Date patient discharged from therapy: 06/20/15   Leonette Monarch, LRT/CTRS 06/21/2015, 12:05 PM

## 2015-06-21 NOTE — Discharge Summary (Signed)
Physician Discharge Summary Note  Patient:  Nicole Colon is an 35 y.o., female MRN:  [id number] DOB:  [birth year] Patient phone:  [phone] (home)  Patient address:   [address]  [place] [address],  Total Time spent with patient: 1 hour  Date of Admission:  06/20/2015 Date of Discharge: 06/20/2015  Reason for Admission:  Psychosis.  Identifying data. Nicole Colon is a 35 year old female with a history of anxiety, fibromyalgia, and new onset paranoid delusions.  Chief complaint. "I lose a little better now."  History of present illness. Information was obtained from the patient and the chart. The patient has a long history of anxiety for which she has been prescribed Lexapro and ADHD for which she was taking Adderall. In December the dose of Adderall was increased from 20 to 30 mg and Wellbutrin was added to her regimen. The patient's did not notice any benefits. Moreover she feels that she became more anxious. 3 days ago she started experiencing new onset of paranoid delusions. She felt that there are parasites in her mouth, on her skin, on the carpet and tile at home and eventually on her children. Her husband took out a petition as he believe she became dysfunctional. The patient reports that for the past 3 months she had thrush in her mouth 5 times. She is under investigation for immune dysfunction that may also cause fibromyalgia. She believes that this cohort in her mouth S to her unusual behavior. Before coming to the hospital at home she has been cleaning her apartment incessantly. She spent 2 days at the crisis center when her medications were discontinued. During the interview she denies any symptoms of depression, anxiety, or psychosis. She believes that her paranoia resolved completely and he has good insight into her problems. She believes that recent adjustments of medications would have contributed to her symptoms. She no longer wants to take Wellbutrin or stimulants.  She has been frightened by unusual behaviors. She talked to her family including her husband since admission. She reports that everybody believes she is at baseline. She adamantly denies misusing her medications. She does not drink alcohol or use illicit drugs.  Past psychiatric history. She has never been hospitalized. There were no suicide attempts. She reports that she was bullied in school and received some psychotherapy then. She has been on medications for anxiety for the past 7 years after her husband was diagnosed with Asperger's, she developed fibromyalgia and had to quit her job. She reports that Lexapro has been very helpful in eliminating number of panic attacks. She denies any anxiety today but in the past she experienced panic attacks, generalized anxiety symptoms and some symptoms suggestive of OCD traits.  Family psychiatric history. Her 18 year old son may have a mild form of autism.  Social history. 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 Since she stopped working the family is in financial difficulties. Dealing with her 48 year old son with possible autism has been very difficult. Also suffering from fibromyalgia as to her stress.  Principal Problem: Delusional disorder, somatic type, first episode, currently in acute episode [facility]) Discharge Diagnoses: Patient Active Problem List   Diagnosis Date Noted  . Attention deficit hyperactivity disorder (ADHD) [F90.9] 06/20/2015  . Fibromyalgia [M79.7] 06/20/2015  . Generalized anxiety disorder [F41.1] 06/20/2015  . Delusional disorder, somatic type, first episode, currently in acute episode (HCC) [F22] 06/19/2015    Past  Psychiatric History: anxiety, chronic pain, ADHD.  Past Medical History:  Past Medical History  Diagnosis Date  . Fibromyalgia   . Anxiety    History reviewed. No pertinent past surgical  history. Family History: History reviewed. No pertinent family history. Family Psychiatric  History: child with autism. Social History:  History  Alcohol Use No     History  Drug Use No    Social History   Social History  . Marital Status: Single    Spouse Name: N/A  . Number of Children: N/A  . Years of Education: N/A   Social History Main Topics  . Smoking status: Never Smoker   . Smokeless tobacco: None  . Alcohol Use: No  . Drug Use: No  . Sexual Activity: Not Asked   Other Topics Concern  . None   Social History Narrative    Hospital Course:    Nicole Colon is a 35 year old female with a history of anxiety, ADHD, and fibromyalgia admitted for delusional thinking.  1. Paranoid delusions. This has resolved prior to admission. The patient was not interested in starting an antipsychotic.  2. Mood and anxiety. The patient has been maintained on a combination of Wellbutrin, Adderall and Lexapro in the community. She believes that recently increased doses of Wellbutrin and Adderall could have contributed to her problems. We discontinued Wellbutrin.  3. ADHD. The patient has been off her stimulant for several days now. She does not wish to restart it.   4. Fibromyalgia. She has been maintained on Lyrica and, as needed, tramadol and baclofen. None of them were given here.  5. Disposition. She was discharged to home with family. She will follow up with her psychiatrist.  Physical Findings: AIMS:  , ,  ,  ,    CIWA:    COWS:     Musculoskeletal: Strength & Muscle Tone: within normal limits Gait & Station: normal Patient leans: N/A  Psychiatric Specialty Exam: Review of Systems  Musculoskeletal: Positive for myalgias.  All other systems reviewed and are negative.   Blood pressure 135/78, pulse 98, temperature 98 F (36.7 C), temperature source Oral, resp. rate 20, height  (1.676 m), weight 97.977 kg (216 lb), SpO2 98 %.Body mass index is 34.88 kg/(m^2).   See SRA.                                                  Sleep:      Have you used any form of tobacco in the last 30 days? (Cigarettes, Smokeless Tobacco, Cigars, and/or Pipes): No  Has this patient used any form of tobacco in the last 30 days? (Cigarettes, Smokeless Tobacco, Cigars, and/or Pipes) Yes, No  Blood Alcohol level:  No results found for: [facility]  Metabolic Disorder Labs:  Lab Results  Component Value Date   HGBA1C 4.4 06/20/2015   No results found for: PROLACTIN Lab Results  Component Value Date   CHOL 166 06/20/2015   TRIG 95 06/20/2015   HDL 65 06/20/2015   CHOLHDL 2.6 06/20/2015   VLDL 19 06/20/2015   LDLCALC 82 06/20/2015    See Psychiatric Specialty Exam and Suicide Risk Assessment completed by Attending Physician prior to discharge.  Discharge destination:  Home  Is patient on multiple antipsychotic therapies at discharge:  No   Has Patient had three or more failed trials of antipsychotic  monotherapy by history:  No  Recommended Plan for Multiple Antipsychotic Therapies: NA  Discharge Instructions    Diet - low sodium heart healthy    Complete by:  As directed      Increase activity slowly    Complete by:  As directed             Medication List    STOP taking these medications        amphetamine-dextroamphetamine 30 MG tablet  Commonly known as:  ADDERALL     buPROPion 100 MG tablet  Commonly known as:  WELLBUTRIN     promethazine 25 MG tablet  Commonly known as:  PHENERGAN      TAKE these medications      Indication   baclofen 20 MG tablet  Commonly known as:  LIORESAL  Take 1 tablet by mouth 3 (three) times daily as needed.      escitalopram 20 MG tablet  Commonly known as:  LEXAPRO  Take 1 tablet by mouth daily.      LYRICA 150 MG capsule  Generic drug:  pregabalin  Take 1 capsule by mouth 2 (two) times daily.      traMADol 50 MG tablet  Commonly known as:  ULTRAM  Take 1-2 tablets by mouth every 6  (six) hours as needed.            Follow-up Information    Follow up with [facility].   Why:  Please arrive to the walk-in clinic on Mon and Thurs from 8am-5pm, Tuesday and Wednesday  from 8am-3pm and on Friday from 8am-1pm for your assessment for medication managment and therapy   Contact information:   [address] [place], [place] [address] [phone]) [phone] Fax: [phone]      Follow up with [facility].   Why:  Please arrive for your medical hospital follow up appointment with Dr. Larey Seat on Monday February 27th at [facility] information:   [address] [place], [place] [address] Phone: [phone] Fax: [phone]      Follow-up recommendations:  Activity:  as tolerated. Diet:  low sodium heart healthy. Other:  keep follow up appointments.  Comments:    Signed: Kristine Linea, MD 06/21/2015, 3:25 AM

## 2019-04-26 ENCOUNTER — Other Ambulatory Visit: Payer: Self-pay | Admitting: Obstetrics and Gynecology

## 2019-04-28 ENCOUNTER — Other Ambulatory Visit: Payer: Self-pay | Admitting: Obstetrics and Gynecology

## 2019-04-28 DIAGNOSIS — R5381 Other malaise: Secondary | ICD-10-CM

## 2019-05-19 DIAGNOSIS — Z20822 Contact with and (suspected) exposure to covid-19: Secondary | ICD-10-CM | POA: Diagnosis not present

## 2019-05-19 DIAGNOSIS — J019 Acute sinusitis, unspecified: Secondary | ICD-10-CM | POA: Diagnosis not present

## 2019-05-19 DIAGNOSIS — J209 Acute bronchitis, unspecified: Secondary | ICD-10-CM | POA: Diagnosis not present

## 2019-05-31 DIAGNOSIS — Z9884 Bariatric surgery status: Secondary | ICD-10-CM | POA: Diagnosis not present

## 2019-05-31 DIAGNOSIS — Z1331 Encounter for screening for depression: Secondary | ICD-10-CM | POA: Diagnosis not present

## 2019-05-31 DIAGNOSIS — F988 Other specified behavioral and emotional disorders with onset usually occurring in childhood and adolescence: Secondary | ICD-10-CM | POA: Diagnosis not present

## 2019-05-31 DIAGNOSIS — M797 Fibromyalgia: Secondary | ICD-10-CM | POA: Diagnosis not present

## 2019-05-31 DIAGNOSIS — F419 Anxiety disorder, unspecified: Secondary | ICD-10-CM | POA: Diagnosis not present

## 2019-06-17 DIAGNOSIS — N938 Other specified abnormal uterine and vaginal bleeding: Secondary | ICD-10-CM | POA: Diagnosis not present

## 2019-06-17 DIAGNOSIS — F320 Major depressive disorder, single episode, mild: Secondary | ICD-10-CM | POA: Diagnosis not present

## 2019-06-17 DIAGNOSIS — R195 Other fecal abnormalities: Secondary | ICD-10-CM | POA: Diagnosis not present

## 2019-06-17 DIAGNOSIS — Z9884 Bariatric surgery status: Secondary | ICD-10-CM | POA: Diagnosis not present

## 2019-06-17 DIAGNOSIS — K319 Disease of stomach and duodenum, unspecified: Secondary | ICD-10-CM | POA: Diagnosis not present

## 2019-06-17 DIAGNOSIS — Z8616 Personal history of COVID-19: Secondary | ICD-10-CM | POA: Diagnosis not present

## 2019-06-17 DIAGNOSIS — R5383 Other fatigue: Secondary | ICD-10-CM | POA: Diagnosis not present

## 2019-06-17 DIAGNOSIS — D509 Iron deficiency anemia, unspecified: Secondary | ICD-10-CM | POA: Diagnosis not present

## 2019-06-17 DIAGNOSIS — N920 Excessive and frequent menstruation with regular cycle: Secondary | ICD-10-CM | POA: Diagnosis not present

## 2019-06-17 DIAGNOSIS — M797 Fibromyalgia: Secondary | ICD-10-CM | POA: Diagnosis not present

## 2019-06-17 DIAGNOSIS — Z9049 Acquired absence of other specified parts of digestive tract: Secondary | ICD-10-CM | POA: Diagnosis not present

## 2019-06-17 DIAGNOSIS — Z6832 Body mass index (BMI) 32.0-32.9, adult: Secondary | ICD-10-CM | POA: Diagnosis not present

## 2019-06-17 DIAGNOSIS — F419 Anxiety disorder, unspecified: Secondary | ICD-10-CM | POA: Diagnosis not present

## 2019-06-17 DIAGNOSIS — E669 Obesity, unspecified: Secondary | ICD-10-CM | POA: Diagnosis not present

## 2019-06-17 DIAGNOSIS — D62 Acute posthemorrhagic anemia: Secondary | ICD-10-CM | POA: Diagnosis not present

## 2019-06-17 DIAGNOSIS — D649 Anemia, unspecified: Secondary | ICD-10-CM | POA: Diagnosis not present

## 2019-06-17 DIAGNOSIS — F29 Unspecified psychosis not due to a substance or known physiological condition: Secondary | ICD-10-CM | POA: Diagnosis not present

## 2019-06-17 DIAGNOSIS — K648 Other hemorrhoids: Secondary | ICD-10-CM | POA: Diagnosis not present

## 2019-06-17 DIAGNOSIS — K922 Gastrointestinal hemorrhage, unspecified: Secondary | ICD-10-CM | POA: Diagnosis not present

## 2019-06-17 DIAGNOSIS — K274 Chronic or unspecified peptic ulcer, site unspecified, with hemorrhage: Secondary | ICD-10-CM | POA: Diagnosis not present

## 2019-06-18 DIAGNOSIS — R195 Other fecal abnormalities: Secondary | ICD-10-CM | POA: Diagnosis not present

## 2019-06-18 DIAGNOSIS — D509 Iron deficiency anemia, unspecified: Secondary | ICD-10-CM | POA: Diagnosis not present

## 2019-06-18 DIAGNOSIS — D473 Essential (hemorrhagic) thrombocythemia: Secondary | ICD-10-CM | POA: Diagnosis not present

## 2019-06-18 DIAGNOSIS — R110 Nausea: Secondary | ICD-10-CM | POA: Diagnosis not present

## 2019-06-18 DIAGNOSIS — Z79899 Other long term (current) drug therapy: Secondary | ICD-10-CM | POA: Diagnosis not present

## 2019-06-18 DIAGNOSIS — N920 Excessive and frequent menstruation with regular cycle: Secondary | ICD-10-CM | POA: Diagnosis not present

## 2019-06-19 DIAGNOSIS — K922 Gastrointestinal hemorrhage, unspecified: Secondary | ICD-10-CM | POA: Diagnosis not present

## 2019-06-19 DIAGNOSIS — U071 COVID-19: Secondary | ICD-10-CM | POA: Diagnosis not present

## 2019-06-19 DIAGNOSIS — K289 Gastrojejunal ulcer, unspecified as acute or chronic, without hemorrhage or perforation: Secondary | ICD-10-CM | POA: Diagnosis not present

## 2019-06-19 DIAGNOSIS — D509 Iron deficiency anemia, unspecified: Secondary | ICD-10-CM | POA: Diagnosis not present

## 2019-06-19 DIAGNOSIS — N939 Abnormal uterine and vaginal bleeding, unspecified: Secondary | ICD-10-CM | POA: Diagnosis not present

## 2019-06-19 DIAGNOSIS — D500 Iron deficiency anemia secondary to blood loss (chronic): Secondary | ICD-10-CM | POA: Diagnosis not present

## 2019-06-19 DIAGNOSIS — Z9884 Bariatric surgery status: Secondary | ICD-10-CM | POA: Diagnosis not present

## 2019-06-19 DIAGNOSIS — K319 Disease of stomach and duodenum, unspecified: Secondary | ICD-10-CM | POA: Diagnosis not present

## 2019-06-19 DIAGNOSIS — Z79899 Other long term (current) drug therapy: Secondary | ICD-10-CM | POA: Diagnosis not present

## 2019-06-19 DIAGNOSIS — K648 Other hemorrhoids: Secondary | ICD-10-CM | POA: Diagnosis not present

## 2019-06-19 DIAGNOSIS — D62 Acute posthemorrhagic anemia: Secondary | ICD-10-CM | POA: Diagnosis not present

## 2019-06-20 DIAGNOSIS — K274 Chronic or unspecified peptic ulcer, site unspecified, with hemorrhage: Secondary | ICD-10-CM | POA: Diagnosis not present

## 2019-06-20 DIAGNOSIS — R5383 Other fatigue: Secondary | ICD-10-CM | POA: Diagnosis not present

## 2019-06-20 DIAGNOSIS — K259 Gastric ulcer, unspecified as acute or chronic, without hemorrhage or perforation: Secondary | ICD-10-CM | POA: Diagnosis not present

## 2019-06-20 DIAGNOSIS — F209 Schizophrenia, unspecified: Secondary | ICD-10-CM | POA: Diagnosis not present

## 2019-06-20 DIAGNOSIS — N939 Abnormal uterine and vaginal bleeding, unspecified: Secondary | ICD-10-CM | POA: Diagnosis not present

## 2019-06-20 DIAGNOSIS — K648 Other hemorrhoids: Secondary | ICD-10-CM | POA: Diagnosis not present

## 2019-06-20 DIAGNOSIS — F320 Major depressive disorder, single episode, mild: Secondary | ICD-10-CM | POA: Diagnosis not present

## 2019-06-20 DIAGNOSIS — Z9884 Bariatric surgery status: Secondary | ICD-10-CM | POA: Diagnosis not present

## 2019-06-20 DIAGNOSIS — M797 Fibromyalgia: Secondary | ICD-10-CM | POA: Diagnosis not present

## 2019-06-20 DIAGNOSIS — F29 Unspecified psychosis not due to a substance or known physiological condition: Secondary | ICD-10-CM | POA: Diagnosis not present

## 2019-06-20 DIAGNOSIS — Z9049 Acquired absence of other specified parts of digestive tract: Secondary | ICD-10-CM | POA: Diagnosis not present

## 2019-06-20 DIAGNOSIS — N888 Other specified noninflammatory disorders of cervix uteri: Secondary | ICD-10-CM | POA: Diagnosis not present

## 2019-06-20 DIAGNOSIS — Z8616 Personal history of COVID-19: Secondary | ICD-10-CM | POA: Diagnosis not present

## 2019-06-20 DIAGNOSIS — K289 Gastrojejunal ulcer, unspecified as acute or chronic, without hemorrhage or perforation: Secondary | ICD-10-CM | POA: Diagnosis not present

## 2019-06-20 DIAGNOSIS — F419 Anxiety disorder, unspecified: Secondary | ICD-10-CM | POA: Diagnosis not present

## 2019-06-20 DIAGNOSIS — N920 Excessive and frequent menstruation with regular cycle: Secondary | ICD-10-CM | POA: Diagnosis not present

## 2019-06-20 DIAGNOSIS — E669 Obesity, unspecified: Secondary | ICD-10-CM | POA: Diagnosis not present

## 2019-06-20 DIAGNOSIS — N938 Other specified abnormal uterine and vaginal bleeding: Secondary | ICD-10-CM | POA: Diagnosis not present

## 2019-06-20 DIAGNOSIS — D62 Acute posthemorrhagic anemia: Secondary | ICD-10-CM | POA: Diagnosis not present

## 2019-06-20 DIAGNOSIS — R195 Other fecal abnormalities: Secondary | ICD-10-CM | POA: Diagnosis not present

## 2019-06-20 DIAGNOSIS — U071 COVID-19: Secondary | ICD-10-CM | POA: Diagnosis not present

## 2019-06-20 DIAGNOSIS — N399 Disorder of urinary system, unspecified: Secondary | ICD-10-CM | POA: Diagnosis not present

## 2019-06-20 DIAGNOSIS — D509 Iron deficiency anemia, unspecified: Secondary | ICD-10-CM | POA: Diagnosis not present

## 2019-06-27 DIAGNOSIS — D649 Anemia, unspecified: Secondary | ICD-10-CM | POA: Diagnosis not present

## 2019-06-27 DIAGNOSIS — Z09 Encounter for follow-up examination after completed treatment for conditions other than malignant neoplasm: Secondary | ICD-10-CM | POA: Diagnosis not present

## 2019-06-27 DIAGNOSIS — K274 Chronic or unspecified peptic ulcer, site unspecified, with hemorrhage: Secondary | ICD-10-CM | POA: Diagnosis not present

## 2019-06-27 DIAGNOSIS — F419 Anxiety disorder, unspecified: Secondary | ICD-10-CM | POA: Diagnosis not present

## 2019-07-18 DIAGNOSIS — K274 Chronic or unspecified peptic ulcer, site unspecified, with hemorrhage: Secondary | ICD-10-CM | POA: Diagnosis not present

## 2019-07-18 DIAGNOSIS — D649 Anemia, unspecified: Secondary | ICD-10-CM | POA: Diagnosis not present

## 2019-07-18 DIAGNOSIS — F419 Anxiety disorder, unspecified: Secondary | ICD-10-CM | POA: Diagnosis not present

## 2019-07-18 DIAGNOSIS — M797 Fibromyalgia: Secondary | ICD-10-CM | POA: Diagnosis not present

## 2019-08-11 DIAGNOSIS — M19011 Primary osteoarthritis, right shoulder: Secondary | ICD-10-CM | POA: Diagnosis not present

## 2019-08-11 DIAGNOSIS — F419 Anxiety disorder, unspecified: Secondary | ICD-10-CM | POA: Diagnosis not present

## 2019-08-11 DIAGNOSIS — M797 Fibromyalgia: Secondary | ICD-10-CM | POA: Diagnosis not present

## 2019-08-11 DIAGNOSIS — Z6833 Body mass index (BMI) 33.0-33.9, adult: Secondary | ICD-10-CM | POA: Diagnosis not present

## 2019-08-11 DIAGNOSIS — M25511 Pain in right shoulder: Secondary | ICD-10-CM | POA: Diagnosis not present

## 2019-08-25 DIAGNOSIS — M25511 Pain in right shoulder: Secondary | ICD-10-CM | POA: Diagnosis not present

## 2019-08-30 DIAGNOSIS — M25511 Pain in right shoulder: Secondary | ICD-10-CM | POA: Diagnosis not present

## 2019-09-01 DIAGNOSIS — Z79899 Other long term (current) drug therapy: Secondary | ICD-10-CM | POA: Diagnosis not present

## 2019-09-01 DIAGNOSIS — R5383 Other fatigue: Secondary | ICD-10-CM | POA: Diagnosis not present

## 2019-09-01 DIAGNOSIS — Z8711 Personal history of peptic ulcer disease: Secondary | ICD-10-CM | POA: Diagnosis not present

## 2019-09-01 DIAGNOSIS — M25511 Pain in right shoulder: Secondary | ICD-10-CM | POA: Diagnosis not present

## 2019-09-01 DIAGNOSIS — D509 Iron deficiency anemia, unspecified: Secondary | ICD-10-CM | POA: Diagnosis not present

## 2019-09-05 DIAGNOSIS — M7501 Adhesive capsulitis of right shoulder: Secondary | ICD-10-CM | POA: Diagnosis not present

## 2019-09-05 DIAGNOSIS — S43431A Superior glenoid labrum lesion of right shoulder, initial encounter: Secondary | ICD-10-CM | POA: Diagnosis not present

## 2019-09-11 DIAGNOSIS — R7989 Other specified abnormal findings of blood chemistry: Secondary | ICD-10-CM | POA: Diagnosis not present

## 2019-09-13 DIAGNOSIS — H9191 Unspecified hearing loss, right ear: Secondary | ICD-10-CM | POA: Diagnosis not present

## 2019-09-13 DIAGNOSIS — R7989 Other specified abnormal findings of blood chemistry: Secondary | ICD-10-CM | POA: Diagnosis not present

## 2019-09-13 DIAGNOSIS — S43431A Superior glenoid labrum lesion of right shoulder, initial encounter: Secondary | ICD-10-CM | POA: Diagnosis not present

## 2019-09-13 DIAGNOSIS — D509 Iron deficiency anemia, unspecified: Secondary | ICD-10-CM | POA: Diagnosis not present

## 2019-09-13 DIAGNOSIS — Z6834 Body mass index (BMI) 34.0-34.9, adult: Secondary | ICD-10-CM | POA: Diagnosis not present

## 2019-09-13 DIAGNOSIS — M25611 Stiffness of right shoulder, not elsewhere classified: Secondary | ICD-10-CM | POA: Diagnosis not present

## 2019-09-13 DIAGNOSIS — M7501 Adhesive capsulitis of right shoulder: Secondary | ICD-10-CM | POA: Diagnosis not present

## 2019-09-15 DIAGNOSIS — M25611 Stiffness of right shoulder, not elsewhere classified: Secondary | ICD-10-CM | POA: Diagnosis not present

## 2019-09-15 DIAGNOSIS — M7501 Adhesive capsulitis of right shoulder: Secondary | ICD-10-CM | POA: Diagnosis not present

## 2019-09-15 DIAGNOSIS — S43431A Superior glenoid labrum lesion of right shoulder, initial encounter: Secondary | ICD-10-CM | POA: Diagnosis not present

## 2019-09-22 DIAGNOSIS — M7501 Adhesive capsulitis of right shoulder: Secondary | ICD-10-CM | POA: Diagnosis not present

## 2019-09-22 DIAGNOSIS — S43431A Superior glenoid labrum lesion of right shoulder, initial encounter: Secondary | ICD-10-CM | POA: Diagnosis not present

## 2019-09-22 DIAGNOSIS — M25611 Stiffness of right shoulder, not elsewhere classified: Secondary | ICD-10-CM | POA: Diagnosis not present

## 2019-10-06 DIAGNOSIS — D509 Iron deficiency anemia, unspecified: Secondary | ICD-10-CM | POA: Diagnosis not present

## 2019-10-06 DIAGNOSIS — S43431A Superior glenoid labrum lesion of right shoulder, initial encounter: Secondary | ICD-10-CM | POA: Diagnosis not present

## 2019-10-06 DIAGNOSIS — M7501 Adhesive capsulitis of right shoulder: Secondary | ICD-10-CM | POA: Diagnosis not present

## 2019-10-06 DIAGNOSIS — M25611 Stiffness of right shoulder, not elsewhere classified: Secondary | ICD-10-CM | POA: Diagnosis not present

## 2019-10-06 DIAGNOSIS — R7989 Other specified abnormal findings of blood chemistry: Secondary | ICD-10-CM | POA: Diagnosis not present

## 2019-10-08 ENCOUNTER — Emergency Department: Payer: BC Managed Care – PPO

## 2019-10-08 ENCOUNTER — Encounter: Payer: Self-pay | Admitting: Emergency Medicine

## 2019-10-08 ENCOUNTER — Emergency Department
Admission: EM | Admit: 2019-10-08 | Discharge: 2019-10-08 | Disposition: A | Discharge status: Home / Self Care | Payer: BC Managed Care – PPO | Attending: Student | Admitting: Student

## 2019-10-08 DIAGNOSIS — R410 Disorientation, unspecified: Secondary | ICD-10-CM | POA: Diagnosis not present

## 2019-10-08 DIAGNOSIS — R42 Dizziness and giddiness: Secondary | ICD-10-CM | POA: Diagnosis not present

## 2019-10-08 DIAGNOSIS — R569 Unspecified convulsions: Secondary | ICD-10-CM

## 2019-10-08 DIAGNOSIS — E162 Hypoglycemia, unspecified: Secondary | ICD-10-CM | POA: Diagnosis not present

## 2019-10-08 DIAGNOSIS — E161 Other hypoglycemia: Secondary | ICD-10-CM | POA: Diagnosis not present

## 2019-10-08 DIAGNOSIS — F1510 Other stimulant abuse, uncomplicated: Secondary | ICD-10-CM | POA: Insufficient documentation

## 2019-10-08 DIAGNOSIS — R000 Tachycardia, unspecified: Secondary | ICD-10-CM | POA: Diagnosis not present

## 2019-10-08 HISTORY — DX: Unspecified convulsions: R56.9

## 2019-10-08 LAB — URINE DRUG SCREEN, QUALITATIVE (ARMC ONLY)
Amphetamines, Ur Screen: POSITIVE — AB
Barbiturates, Ur Screen: NOT DETECTED
Benzodiazepine, Ur Scrn: NOT DETECTED
Cannabinoid 50 Ng, Ur ~~LOC~~: NOT DETECTED
Cocaine Metabolite,Ur ~~LOC~~: NOT DETECTED
MDMA (Ecstasy)Ur Screen: NOT DETECTED
Methadone Scn, Ur: NOT DETECTED
Opiate, Ur Screen: NOT DETECTED
Phencyclidine (PCP) Ur S: NOT DETECTED
Tricyclic, Ur Screen: NOT DETECTED

## 2019-10-08 LAB — CBC WITH DIFFERENTIAL/PLATELET
Abs Immature Granulocytes: 0.05 10*3/uL (ref 0.00–0.07)
Basophils Absolute: 0.1 10*3/uL (ref 0.0–0.1)
Basophils Relative: 1 %
Eosinophils Absolute: 0 10*3/uL (ref 0.0–0.5)
Eosinophils Relative: 0 %
HCT: 37.5 % (ref 36.0–46.0)
Hemoglobin: 12 g/dL (ref 12.0–15.0)
Immature Granulocytes: 1 %
Lymphocytes Relative: 17 %
Lymphs Abs: 1.7 10*3/uL (ref 0.7–4.0)
MCH: 27.5 pg (ref 26.0–34.0)
MCHC: 32 g/dL (ref 30.0–36.0)
MCV: 85.8 fL (ref 80.0–100.0)
Monocytes Absolute: 0.8 10*3/uL (ref 0.1–1.0)
Monocytes Relative: 8 %
Neutro Abs: 7.3 10*3/uL (ref 1.7–7.7)
Neutrophils Relative %: 73 %
Platelets: 299 10*3/uL (ref 150–400)
RBC: 4.37 MIL/uL (ref 3.87–5.11)
RDW: 13.3 % (ref 11.5–15.5)
WBC: 10 10*3/uL (ref 4.0–10.5)
nRBC: 0 % (ref 0.0–0.2)

## 2019-10-08 LAB — URINALYSIS, COMPLETE (UACMP) WITH MICROSCOPIC
Bilirubin Urine: NEGATIVE
Glucose, UA: NEGATIVE mg/dL
Hgb urine dipstick: NEGATIVE
Ketones, ur: 5 mg/dL — AB
Leukocytes,Ua: NEGATIVE
Nitrite: NEGATIVE
Protein, ur: NEGATIVE mg/dL
Specific Gravity, Urine: 1.014 (ref 1.005–1.030)
pH: 6 (ref 5.0–8.0)

## 2019-10-08 LAB — COMPREHENSIVE METABOLIC PANEL
ALT: 25 U/L (ref 0–44)
AST: 38 U/L (ref 15–41)
Albumin: 3.9 g/dL (ref 3.5–5.0)
Alkaline Phosphatase: 95 U/L (ref 38–126)
Anion gap: 11 (ref 5–15)
BUN: 21 mg/dL — ABNORMAL HIGH (ref 6–20)
CO2: 23 mmol/L (ref 22–32)
Calcium: 8.9 mg/dL (ref 8.9–10.3)
Chloride: 102 mmol/L (ref 98–111)
Creatinine, Ser: 0.66 mg/dL (ref 0.44–1.00)
GFR calc Af Amer: >60 mL/min (ref >60)
GFR calc non Af Amer: >60 mL/min (ref >60)
Glucose, Bld: 98 mg/dL (ref 70–99)
Potassium: 3.4 mmol/L — ABNORMAL LOW (ref 3.5–5.1)
Sodium: 136 mmol/L (ref 135–145)
Total Bilirubin: 0.7 mg/dL (ref 0.3–1.2)
Total Protein: 6.7 g/dL (ref 6.5–8.1)

## 2019-10-08 LAB — TROPONIN I (HIGH SENSITIVITY): Troponin I (High Sensitivity): 4 ng/L (ref <18)

## 2019-10-08 LAB — PROTIME-INR
INR: 0.9 (ref 0.8–1.2)
Prothrombin Time: 12 seconds (ref 11.4–15.2)

## 2019-10-08 LAB — PREGNANCY, URINE: Preg Test, Ur: NEGATIVE

## 2019-10-08 MED ORDER — SODIUM CHLORIDE 0.9 % IV BOLUS
1000.0000 mL | Freq: Once | INTRAVENOUS | Status: AC
  Administered 2019-10-08: 1000 mL via INTRAVENOUS

## 2019-10-08 NOTE — ED Triage Notes (Signed)
Pt to ED by EMS from [employer] [facility] after having witnessed seizure activity. Pt has no hx of seizures. Per EMS upon arrival EMS states AMS and that Pt's CBG was 69 and they administered 200 of D10.

## 2019-10-08 NOTE — ED Notes (Signed)
Peripheral IV discontinued. Catheter intact. No signs of infiltration or redness. Gauze applied to IV site.    Discharge instructions reviewed with patient. Questions fielded by this RN. Patient verbalizes understanding of instructions. Patient discharged home in stable condition per provider . No acute distress noted at time of discharge.   Ambulatory to DC

## 2019-10-08 NOTE — ED Provider Notes (Signed)
[facility] Emergency Department Provider Note  ____________________________________________  Time seen: Approximately 4:46 PM  I have reviewed the triage vital signs and the nursing notes.   HISTORY  Chief Complaint Seizures    HPI Nicole Colon is a 39 y.o. female who presents the emergency department complaining of new onset seizure.  Patient states that she was at a store with her daughter when she started to feel lightheaded/dizzy.  Patient states that she was proceeding to the register to check out so she could go sit in her car when the next and she remembers is people standing over her.  Patient states that apparently she lost consciousness, had seizure-like activity according to surrounding people.  Patient has no history of seizures.  She denies any recent illnesses.  Patient denies any headache, visual changes, chest pain, shortness of breath abdominal pain, nausea or vomiting.  She has chronic allergic rhinitis and takes daily meds for same.  Other than this she has a history of anxiety and fibromyalgia.  No history of similar episodes.         Past Medical History:  Diagnosis Date  . Anxiety   . Fibromyalgia     Patient Active Problem List   Diagnosis Date Noted  . Attention deficit hyperactivity disorder (ADHD) 06/20/2015  . Fibromyalgia 06/20/2015  . Generalized anxiety disorder 06/20/2015  . Delusional disorder, somatic type, first episode, currently in acute episode (HCC) 06/19/2015    History reviewed. No pertinent surgical history.  Prior to Admission medications   Medication Sig Start Date End Date Taking? Authorizing Provider  baclofen (LIORESAL) 20 MG tablet Take 1 tablet by mouth 3 (three) times daily as needed. 05/16/15   [provider]  escitalopram (LEXAPRO) 20 MG tablet Take 1 tablet by mouth daily. 05/22/15   [provider]  LYRICA 150 MG capsule Take 1 capsule by mouth 2 (two) times daily. 05/16/15    [provider]  traMADol (ULTRAM) 50 MG tablet Take 1-2 tablets by mouth every 6 (six) hours as needed. 05/17/15   [provider]    Allergies Amoxicillin and Orange (diagnostic)  No family history on file.  Social History Social History   Tobacco Use  . Smoking status: Never Smoker  . Smokeless tobacco: Never Used  Substance Use Topics  . Alcohol use: No  . Drug use: No     Review of Systems  Constitutional: No fever/chills Eyes: No visual changes. No discharge ENT: No upper respiratory complaints. Cardiovascular: no chest pain. Respiratory: no cough. No SOB. Gastrointestinal: No abdominal pain.  No nausea, no vomiting.  No diarrhea.  No constipation. Genitourinary: Negative for dysuria. No hematuria Musculoskeletal: Negative for musculoskeletal pain. Skin: Negative for rash, abrasions, lacerations, ecchymosis. Neurological: Negative for headaches, focal weakness or numbness.  Patient states that she apparently had a seizure today.  Patient does not remember the episode. 10-point ROS otherwise negative.  ____________________________________________   PHYSICAL EXAM:  VITAL SIGNS: ED Triage Vitals  Enc Vitals Group     BP 10/08/19 1642 (!) 123/102     Pulse Rate 10/08/19 1642 (!) 111     Resp 10/08/19 1642 18     Temp 10/08/19 1642 99 F (37.2 C)     Temp Source 10/08/19 1642 Oral     SpO2 10/08/19 1642 97 %     Weight 10/08/19 1634 190 lb (86.2 kg)     Height 10/08/19 1634 5\' 5"  (1.651 m)     Head Circumference --  Peak Flow --      Pain Score 10/08/19 1634 0     Pain Loc --      Pain Edu? --      Excl. in GC? --      Constitutional: Alert and oriented. Well appearing and in no acute distress. Eyes: Conjunctivae are normal. PERRL. EOMI. Head: Atraumatic. ENT:      Ears:       Nose: No congestion/rhinnorhea.      Mouth/Throat: Mucous membranes are moist.  Neck: No stridor.  No cervical spine tenderness to  palpation. Hematological/Lymphatic/Immunilogical: No cervical lymphadenopathy. Cardiovascular: Normal rate, regular rhythm. Normal S1 and S2.  Good peripheral circulation. Respiratory: Normal respiratory effort without tachypnea or retractions. Lungs CTAB. Good air entry to the bases with no decreased or absent breath sounds. Gastrointestinal: Bowel sounds 4 quadrants. Soft and nontender to palpation. No guarding or rigidity. No palpable masses. No distention. No CVA tenderness. Musculoskeletal: Full range of motion to all extremities. No gross deformities appreciated. Neurologic:  Normal speech and language. No gross focal neurologic deficits are appreciated.  Cranial nerves II through XII grossly intact.  Patient does have some mild strabismus on exam. Skin:  Skin is warm, dry and intact. No rash noted. Psychiatric: Mood and affect are normal. Speech and behavior are normal. Patient exhibits appropriate insight and judgement.   ____________________________________________   LABS (all labs ordered are listed, but only abnormal results are displayed)  Labs Reviewed  COMPREHENSIVE METABOLIC PANEL - Abnormal; Notable for the following components:      Result Value   Potassium 3.4 (*)    BUN 21 (*)    All other components within normal limits  URINALYSIS, COMPLETE (UACMP) WITH MICROSCOPIC - Abnormal; Notable for the following components:   Color, Urine AMBER (*)    APPearance CLOUDY (*)    Ketones, ur 5 (*)    Bacteria, UA RARE (*)    All other components within normal limits  URINE DRUG SCREEN, QUALITATIVE ([facility] ONLY) - Abnormal; Notable for the following components:   Amphetamines, Ur Screen POSITIVE (*)    All other components within normal limits  CBC WITH DIFFERENTIAL/PLATELET  PROTIME-INR  PREGNANCY, URINE  TROPONIN I (HIGH SENSITIVITY)  TROPONIN I (HIGH SENSITIVITY)    ____________________________________________  EKG   ____________________________________________  RADIOLOGY I personally viewed and evaluated these images as part of my medical decision making, as well as reviewing the written report by the radiologist.  CT Head Wo Contrast  Result Date: 10/08/2019 CLINICAL DATA:  Seizure EXAM: CT HEAD WITHOUT CONTRAST TECHNIQUE: Contiguous axial images were obtained from the base of the skull through the vertex without intravenous contrast. COMPARISON:  None. FINDINGS: Brain: No evidence of acute infarction, hemorrhage, hydrocephalus, extra-axial collection or mass lesion/mass effect. Vascular: No hyperdense vessel or unexpected calcification. Skull: Normal. Negative for fracture or focal lesion. Sinuses/Orbits: No acute finding. Other: None. IMPRESSION: No acute intracranial pathology. Electronically Signed   By: Lauralyn Primes M.D.   On: 10/08/2019 17:34    ____________________________________________    PROCEDURES  Procedure(s) performed:    Procedures    Medications  sodium chloride 0.9 % bolus 1,000 mL (1,000 mLs Intravenous New Bag/Given 10/08/19 1747)     ____________________________________________   INITIAL IMPRESSION / ASSESSMENT AND PLAN / ED COURSE  Pertinent labs & imaging results that were available during my care of the patient were reviewed by me and considered in my medical decision making (see chart for details).  Review of the [place]  CSRS was performed in accordance of the [place] prior to dispensing any controlled drugs.           Patient's diagnosis is consistent with seizure.  Patient presented to the emergency department after reported seizure while at [employer]  According to the patient she had felt poorly while in the store, was rushing to get to the register to check out when apparently she had a seizure.  Patient states that she was with her daughter, reportedly fell on the way to the register.  I witnesses  stated that she had jerking/seizure-like activity.  According to EMS the patient was confused on their arrival and blood sugar was 69.  They administered D10 in route.  Patient was alert, oriented, answering all questions appropriately.  She does not remember the event.  Patient did not lose control of bowel or bladder.  Patient has no history of seizures.  Patient was neurologically intact at this time.  Labs, imaging are reassuring and at this time I will not start the patient on antiseizure medications.  I do advise the patient to follow-up with neurology for evaluation and not to drive or perform other dangerous activities until cleared by neurology.  Differential included hypoglycemia, syncope, seizure, arrhythmia.  I have advised the patient follow-up with primary care, neurology, cardiology if needed..  Patient is given ED precautions to return to the ED for any worsening or new symptoms.     ____________________________________________  FINAL CLINICAL IMPRESSION(S) / ED DIAGNOSES  Final diagnoses:  Seizure ([place])      NEW MEDICATIONS STARTED DURING THIS VISIT:  ED Discharge Orders    None          This chart was dictated using voice recognition software/Dragon. Despite best efforts to proofread, errors can occur which can change the meaning. Any change was purely unintentional.    Darletta Moll, PA-C 10/08/19 1930    Lilia Pro., MD 10/09/19 [phone]

## 2019-10-10 DIAGNOSIS — M7551 Bursitis of right shoulder: Secondary | ICD-10-CM | POA: Diagnosis not present

## 2019-10-10 DIAGNOSIS — M7501 Adhesive capsulitis of right shoulder: Secondary | ICD-10-CM | POA: Diagnosis not present

## 2019-10-10 DIAGNOSIS — M25611 Stiffness of right shoulder, not elsewhere classified: Secondary | ICD-10-CM | POA: Diagnosis not present

## 2019-10-10 DIAGNOSIS — S43431A Superior glenoid labrum lesion of right shoulder, initial encounter: Secondary | ICD-10-CM | POA: Diagnosis not present

## 2019-10-18 ENCOUNTER — Telehealth: Payer: Self-pay | Admitting: *Deleted

## 2019-10-18 DIAGNOSIS — D509 Iron deficiency anemia, unspecified: Secondary | ICD-10-CM | POA: Diagnosis not present

## 2019-10-18 DIAGNOSIS — R768 Other specified abnormal immunological findings in serum: Secondary | ICD-10-CM | POA: Diagnosis not present

## 2019-10-18 DIAGNOSIS — R569 Unspecified convulsions: Secondary | ICD-10-CM | POA: Diagnosis not present

## 2019-10-18 DIAGNOSIS — R7989 Other specified abnormal findings of blood chemistry: Secondary | ICD-10-CM | POA: Diagnosis not present

## 2019-10-18 DIAGNOSIS — R5383 Other fatigue: Secondary | ICD-10-CM | POA: Diagnosis not present

## 2019-10-18 NOTE — Telephone Encounter (Signed)
Patient called and LVM today at 10:00 AM stating she would like a call back to schedule an appointment. She was seen in the ER recently. Her cb # is [phone].   *I am checking VMs for the clinic. Unsure if we have received a referral for the pt.

## 2019-10-25 ENCOUNTER — Encounter: Payer: Self-pay | Admitting: Diagnostic Neuroimaging

## 2019-10-25 ENCOUNTER — Ambulatory Visit (INDEPENDENT_AMBULATORY_CARE_PROVIDER_SITE_OTHER): Payer: BC Managed Care – PPO | Admitting: Diagnostic Neuroimaging

## 2019-10-25 ENCOUNTER — Other Ambulatory Visit: Payer: Self-pay

## 2019-10-25 VITALS — BP 127/87 | HR 70 | Ht 65.0 in | Wt 212.4 lb

## 2019-10-25 DIAGNOSIS — R4020 Unspecified coma: Secondary | ICD-10-CM

## 2019-10-25 NOTE — Progress Notes (Addendum)
[facility]  PATIENT: Nicole Colon DOB: Aug 23, 1980  REFERRING CLINICIAN: Hamrick, Durward Fortes, MD HISTORY FROM: patient  REASON FOR VISIT: new consult    HISTORICAL  CHIEF COMPLAINT:  Chief Complaint  Patient presents with  . Seizures    rm 7, ED referral, mom- Michelle    HISTORY OF PRESENT ILLNESS:   39 year old female with history of gastric bypass surgery, here for evaluation of possible seizure.  10/08/2019 patient was at the store with family when she started to feel weird.  Apparently she collapsed.  Possible seizure activity was noted.  EMS was called to scene.  Apparently her blood sugar was 69.  She was given dextrose and taken to the hospital.  She woke up while she was getting loaded into the ambulance.  No tongue biting or incontinence.  ER evaluation was completed.  Since that time no further events.  No prior history of seizures or syncope.   REVIEW OF SYSTEMS: Full 14 system review of systems performed and negative with exception of: As per HPI.  ALLERGIES: Allergies  Allergen Reactions  . Amoxicillin   . Orange (Diagnostic)     Oranges/orange dye    HOME MEDICATIONS: Outpatient Medications Prior to Visit  Medication Sig Dispense Refill  . escitalopram (LEXAPRO) 20 MG tablet Take 1 tablet by mouth daily.  2  . oxyCODONE (OXY IR/ROXICODONE) 5 MG immediate release tablet oxycodone 5 mg tablet  Take 1 tablet every 4-6 hours by oral route.    . pregabalin (LYRICA) 100 MG capsule Take 100 mg by mouth 3 (three) times daily.     No facility-administered medications prior to visit.    PAST MEDICAL HISTORY: Past Medical History:  Diagnosis Date  . Anemia    hx of  . Anxiety   . Arthritis   . Fibromyalgia   . Headache   . Seizures (HCC) 10/08/2019    PAST SURGICAL HISTORY: Past Surgical History:  Procedure Laterality Date  . CESAREAN SECTION  [id number]  . CHOLECYSTECTOMY  2010  . GASTRIC BYPASS  2010  . KNEE ARTHROSCOPY      teens  . TONSILLECTOMY  1990    FAMILY HISTORY: Family History  Problem Relation Age of Onset  . Hypertension Mother   . Diabetes Mother   . Fibromyalgia Mother   . Hyperthyroidism Mother   . Melanoma Mother   . High Cholesterol Father   . Migraines Brother   . Glaucoma Maternal Grandmother   . Cancer Maternal Grandmother        breast  . Hypertension Maternal Grandmother   . Hyperthyroidism Maternal Grandmother   . Hypertension Maternal Grandfather   . Cancer Paternal Grandmother   . Cancer Paternal Grandfather     SOCIAL HISTORY: Social History   Socioeconomic History  . Marital status: Married    Spouse name: Apolinar Junes  . Number of children: 2  . Years of education: Not on file  . Highest education level: Some college, no degree  Occupational History    Comment: na  Tobacco Use  . Smoking status: Never Smoker  . Smokeless tobacco: Never Used  Substance and Sexual Activity  . Alcohol use: No  . Drug use: No  . Sexual activity: Not on file  Other Topics Concern  . Not on file  Social History Narrative   Lives with family   Caffeine- take caffeine tabs 1-2 day, each = 1-2 c coffee   Social Determinants of Health   Financial Resource Strain:   .  Difficulty of Paying Living Expenses:   Food Insecurity:   . Worried About Programme researcher, broadcasting/film/video in the Last Year:   . Barista in the Last Year:   Transportation Needs:   . Freight forwarder (Medical):   Marland Kitchen Lack of Transportation (Non-Medical):   Physical Activity:   . Days of Exercise per Week:   . Minutes of Exercise per Session:   Stress:   . Feeling of Stress :   Social Connections:   . Frequency of Communication with Friends and Family:   . Frequency of Social Gatherings with Friends and Family:   . Attends Religious Services:   . Active Member of Clubs or Organizations:   . Attends Banker Meetings:   Marland Kitchen Marital Status:   Intimate Partner Violence:   . Fear of Current or  Ex-Partner:   . Emotionally Abused:   Marland Kitchen Physically Abused:   . Sexually Abused:      PHYSICAL EXAM  GENERAL EXAM/CONSTITUTIONAL: Vitals:  Vitals:   10/25/19 1347  BP: 127/87  Pulse: 70  Weight: 212 lb 6.4 oz (96.3 kg)  Height: 5\' 5"  (1.651 m)     Body mass index is 35.35 kg/m. Wt Readings from Last 3 Encounters:  10/25/19 212 lb 6.4 oz (96.3 kg)  10/08/19 190 lb (86.2 kg)     Patient is in no distress; well developed, nourished and groomed; neck is supple  CARDIOVASCULAR:  Examination of carotid arteries is normal; no carotid bruits  Regular rate and rhythm, no murmurs  Examination of peripheral vascular system by observation and palpation is normal  EYES:  Ophthalmoscopic exam of optic discs and posterior segments is normal; no papilledema or hemorrhages  No exam data present  MUSCULOSKELETAL:  Gait, strength, tone, movements noted in Neurologic exam below  NEUROLOGIC: MENTAL STATUS:  No flowsheet data found.  awake, alert, oriented to person, place and time  recent and remote memory intact  normal attention and concentration  language fluent, comprehension intact, naming intact  fund of knowledge appropriate  CRANIAL NERVE:   2nd - no papilledema on fundoscopic exam  2nd, 3rd, 4th, 6th - pupils equal and reactive to light, visual fields full to confrontation, extraocular muscles intact, no nystagmus  5th - facial sensation symmetric  7th - facial strength symmetric  8th - hearing intact  9th - palate elevates symmetrically, uvula midline  11th - shoulder shrug symmetric  12th - tongue protrusion midline  MOTOR:   normal bulk and tone, full strength in the BUE, BLE  SENSORY:   normal and symmetric to light touch, temperature, vibration  COORDINATION:   finger-nose-finger, fine finger movements normal  REFLEXES:   deep tendon reflexes present and symmetric  GAIT/STATION:   narrow based gait     DIAGNOSTIC DATA  (LABS, IMAGING, TESTING) - I reviewed patient records, labs, notes, testing and imaging myself where available.  Lab Results  Component Value Date   WBC 10.0 10/08/2019   HGB 12.0 10/08/2019   HCT 37.5 10/08/2019   MCV 85.8 10/08/2019   PLT 299 10/08/2019      Component Value Date/Time   NA 136 10/08/2019 1746   K 3.4 (L) 10/08/2019 1746   CL 102 10/08/2019 1746   CO2 23 10/08/2019 1746   GLUCOSE 98 10/08/2019 1746   BUN 21 (H) 10/08/2019 1746   CREATININE 0.66 10/08/2019 1746   CALCIUM 8.9 10/08/2019 1746   PROT 6.7 10/08/2019 1746   ALBUMIN 3.9 10/08/2019  1746   AST 38 10/08/2019 1746   ALT 25 10/08/2019 1746   ALKPHOS 95 10/08/2019 1746   BILITOT 0.7 10/08/2019 1746   GFRNONAA >60 10/08/2019 1746   GFRAA >60 10/08/2019 1746   Lab Results  Component Value Date   CHOL 166 06/20/2015   HDL 65 06/20/2015   LDLCALC 82 06/20/2015   TRIG 95 06/20/2015   CHOLHDL 2.6 06/20/2015   Lab Results  Component Value Date   HGBA1C 4.4 06/20/2015   No results found for: [id number] Lab Results  Component Value Date   TSH 0.249 (L) 06/20/2015    10/08/19 CT head [I reviewed images myself and agree with interpretation. -VRP]  - negative   ASSESSMENT AND PLAN  39 y.o. year old female here with new onset episode of loss of consciousness, possible seizure versus syncope, possibility of hypoglycemia was raised.  We will proceed with further work-up.  Dx:  1. Loss of consciousness (HCC)      PLAN:  ABNORMAL SPELL (? Seizure vs syncope vs hypoglycemia)  - MRI brain, EEG  - According to [place] law, you can not drive unless you are seizure / syncope free for at least 6 months and under physician's care.   - Please maintain precautions. Do not participate in activities where a loss of awareness could harm you or someone else. No swimming alone, no tub bathing, no hot tubs, no driving, no operating motorized vehicles (cars, ATVs, motocycles, etc), lawnmowers, power tools or  firearms. No standing at heights, such as rooftops, ladders or stairs. Avoid hot objects such as stoves, heaters, open fires. Wear a helmet when riding a bicycle, scooter, skateboard, etc. and avoid areas of traffic. Set your water heater to 120 degrees or less.   Orders Placed This Encounter  Procedures  . MR BRAIN W WO CONTRAST  . EEG adult   Return in about 7 months (around 05/26/2020).  I reviewed images, labs, notes, records myself. I summarized findings and reviewed with patient, for this high risk condition (seizure vs syncope) requiring high complexity decision making.    Suanne Marker, MD 10/25/2019, 2:23 PM Certified in Neurology, Neurophysiology and Neuroimaging  [facility] [address] [place], [place] [address] [phone]

## 2019-10-25 NOTE — Patient Instructions (Signed)
ABNORMAL SPELL (? Seizure vs syncope vs hypoglycemia)  - MRI brain, EEG  - According to [place] law, you can not drive unless you are seizure / syncope free for at least 6 months and under physician's care.   - Please maintain precautions. Do not participate in activities where a loss of awareness could harm you or someone else. No swimming alone, no tub bathing, no hot tubs, no driving, no operating motorized vehicles (cars, ATVs, motocycles, etc), lawnmowers, power tools or firearms. No standing at heights, such as rooftops, ladders or stairs. Avoid hot objects such as stoves, heaters, open fires. Wear a helmet when riding a bicycle, scooter, skateboard, etc. and avoid areas of traffic. Set your water heater to 120 degrees or less.

## 2019-10-28 DIAGNOSIS — Z980 Intestinal bypass and anastomosis status: Secondary | ICD-10-CM | POA: Diagnosis not present

## 2019-10-28 DIAGNOSIS — Z9884 Bariatric surgery status: Secondary | ICD-10-CM | POA: Diagnosis not present

## 2019-10-28 DIAGNOSIS — K9189 Other postprocedural complications and disorders of digestive system: Secondary | ICD-10-CM | POA: Diagnosis not present

## 2019-10-28 DIAGNOSIS — K259 Gastric ulcer, unspecified as acute or chronic, without hemorrhage or perforation: Secondary | ICD-10-CM | POA: Diagnosis not present

## 2019-11-01 DIAGNOSIS — E669 Obesity, unspecified: Secondary | ICD-10-CM | POA: Diagnosis not present

## 2019-11-01 DIAGNOSIS — R569 Unspecified convulsions: Secondary | ICD-10-CM | POA: Diagnosis not present

## 2019-11-01 DIAGNOSIS — D649 Anemia, unspecified: Secondary | ICD-10-CM | POA: Diagnosis not present

## 2019-11-01 DIAGNOSIS — E162 Hypoglycemia, unspecified: Secondary | ICD-10-CM | POA: Diagnosis not present

## 2019-11-02 DIAGNOSIS — E162 Hypoglycemia, unspecified: Secondary | ICD-10-CM | POA: Diagnosis not present

## 2019-11-03 ENCOUNTER — Telehealth: Payer: Self-pay | Admitting: Diagnostic Neuroimaging

## 2019-11-03 NOTE — Telephone Encounter (Signed)
MRI scheduled at [facility] for 11/16/19.

## 2019-11-03 NOTE — Telephone Encounter (Signed)
no to the covid questions MR Brain w/wo contrast Dr. Theresa Mulligan Auth: NPR Ref # [id number].   Patient called asking about scheduling a EEG.

## 2019-11-03 NOTE — Telephone Encounter (Signed)
Attempted to reach patient to schedule EEG ordered by Dr Marjory Lies. No answer, voice MB full. If she calls back phone staff can schedule her for EEG.

## 2019-11-04 ENCOUNTER — Ambulatory Visit: Payer: BC Managed Care – PPO | Admitting: Cardiology

## 2019-11-06 DIAGNOSIS — N760 Acute vaginitis: Secondary | ICD-10-CM | POA: Diagnosis not present

## 2019-11-07 ENCOUNTER — Encounter: Payer: Self-pay | Admitting: Cardiology

## 2019-11-14 NOTE — Telephone Encounter (Signed)
I called patient back about rescheduling her MRI. She states her son has a fever and she doesn't want to take any chances. She requested an appointment on 8/3 at 11:00.  Nicole Colon, the system won't let me change the length time from 30 minutes to 60. Can you go behind me and change the time to 60?

## 2019-11-14 NOTE — Telephone Encounter (Signed)
Pt want to reschedule her MRI appt. Please call.

## 2019-11-16 ENCOUNTER — Other Ambulatory Visit: Payer: BC Managed Care – PPO

## 2019-11-16 NOTE — Telephone Encounter (Signed)
Noted. Done.

## 2019-11-19 NOTE — Telephone Encounter (Signed)
Note, thank you 

## 2019-11-22 NOTE — Telephone Encounter (Signed)
I spoke with the patient and reschedule her MRI for 01/03/20 patient requested that date for [facility]   Also, she asked about getting the EEG schedule but I didn't see any order in there.

## 2019-11-22 NOTE — Telephone Encounter (Signed)
Pt called and LVM stating that she is needing to r/s her MRI due to a conflict on that day. Please advise.

## 2019-11-22 NOTE — Telephone Encounter (Signed)
Called patient and scheduled her for EEG same day as MRI at her request. She cannot drive due to seizures, lives in another town and has to coordinate rides. I advised will discuss with billing and call her if any issue. She then stated she always needs medication to relax her for MRI's , is claustrophobic. I advised will send request to Dr Marjory Lies. Patient verbalized understanding, appreciation.

## 2019-11-29 ENCOUNTER — Other Ambulatory Visit: Payer: BC Managed Care – PPO

## 2019-12-12 DIAGNOSIS — K625 Hemorrhage of anus and rectum: Secondary | ICD-10-CM | POA: Diagnosis not present

## 2019-12-12 DIAGNOSIS — Z5321 Procedure and treatment not carried out due to patient leaving prior to being seen by health care provider: Secondary | ICD-10-CM | POA: Diagnosis not present

## 2019-12-12 DIAGNOSIS — N200 Calculus of kidney: Secondary | ICD-10-CM | POA: Diagnosis not present

## 2019-12-13 DIAGNOSIS — Z560 Unemployment, unspecified: Secondary | ICD-10-CM | POA: Diagnosis not present

## 2019-12-13 DIAGNOSIS — E669 Obesity, unspecified: Secondary | ICD-10-CM | POA: Diagnosis not present

## 2019-12-13 DIAGNOSIS — F329 Major depressive disorder, single episode, unspecified: Secondary | ICD-10-CM | POA: Diagnosis not present

## 2019-12-13 DIAGNOSIS — R1013 Epigastric pain: Secondary | ICD-10-CM | POA: Diagnosis not present

## 2019-12-13 DIAGNOSIS — M797 Fibromyalgia: Secondary | ICD-10-CM | POA: Diagnosis not present

## 2019-12-13 DIAGNOSIS — Z79899 Other long term (current) drug therapy: Secondary | ICD-10-CM | POA: Diagnosis not present

## 2019-12-13 DIAGNOSIS — F419 Anxiety disorder, unspecified: Secondary | ICD-10-CM | POA: Diagnosis not present

## 2019-12-13 DIAGNOSIS — N200 Calculus of kidney: Secondary | ICD-10-CM | POA: Diagnosis not present

## 2019-12-13 DIAGNOSIS — Z9884 Bariatric surgery status: Secondary | ICD-10-CM | POA: Diagnosis not present

## 2019-12-13 DIAGNOSIS — K625 Hemorrhage of anus and rectum: Secondary | ICD-10-CM | POA: Diagnosis not present

## 2019-12-16 DIAGNOSIS — K529 Noninfective gastroenteritis and colitis, unspecified: Secondary | ICD-10-CM | POA: Diagnosis not present

## 2019-12-16 DIAGNOSIS — K287 Chronic gastrojejunal ulcer without hemorrhage or perforation: Secondary | ICD-10-CM | POA: Diagnosis not present

## 2019-12-16 DIAGNOSIS — K259 Gastric ulcer, unspecified as acute or chronic, without hemorrhage or perforation: Secondary | ICD-10-CM | POA: Diagnosis not present

## 2019-12-16 DIAGNOSIS — Z980 Intestinal bypass and anastomosis status: Secondary | ICD-10-CM | POA: Diagnosis not present

## 2019-12-16 DIAGNOSIS — Z9884 Bariatric surgery status: Secondary | ICD-10-CM | POA: Diagnosis not present

## 2019-12-19 ENCOUNTER — Telehealth: Payer: Self-pay

## 2019-12-19 NOTE — Telephone Encounter (Signed)
Pt left a VM asking for a call back to discuss her appts in September.

## 2019-12-19 NOTE — Telephone Encounter (Signed)
Spoke with patient and informed her Dr Marjory Lies will follow up with EEG. Advised if she has any further episodes she should call us. She verbalized understanding, appreciation.

## 2019-12-19 NOTE — Telephone Encounter (Signed)
Recommend to follow up EEG first. -VRP

## 2019-12-19 NOTE — Telephone Encounter (Addendum)
Called patient who stated she had seizure this morning witnessed by husband. She stated the previous event may have been due to low BS. EMS checked her fasting sugar, was 67; she was able to eat. BS is normal now. She has EEG on 01/03/20, wanted to let MD know of seizure this morning. I advised will let Dr Marjory Lies know and call her with any recommendations. Patient verbalized understanding, appreciation.

## 2020-01-03 ENCOUNTER — Encounter: Payer: Self-pay | Admitting: *Deleted

## 2020-01-03 ENCOUNTER — Telehealth: Payer: Self-pay | Admitting: *Deleted

## 2020-01-03 ENCOUNTER — Ambulatory Visit (INDEPENDENT_AMBULATORY_CARE_PROVIDER_SITE_OTHER): Payer: BC Managed Care – PPO | Admitting: Diagnostic Neuroimaging

## 2020-01-03 ENCOUNTER — Ambulatory Visit: Payer: BC Managed Care – PPO

## 2020-01-03 DIAGNOSIS — R4020 Unspecified coma: Secondary | ICD-10-CM

## 2020-01-03 MED ORDER — ALPRAZOLAM 0.5 MG PO TABS: ORAL_TABLET | ORAL | 0 refills | Status: AC

## 2020-01-03 NOTE — Telephone Encounter (Signed)
Meds ordered this encounter  Medications  . ALPRAZolam (XANAX) 0.5 MG tablet    Sig: for sedation before MRI scan; take 1 tab 1 hour before scan; may repeat 1 tab 15 min before scan    Dispense:  3 tablet    Refill:  0   Suanne Marker, MD 01/03/2020, 1:07 PM Certified in Neurology, Neurophysiology and Neuroimaging  [facility] [address], [address] [place], [place] [address] [phone]

## 2020-01-03 NOTE — Telephone Encounter (Signed)
Sent my chart and advised her of new Rx.

## 2020-01-03 NOTE — Telephone Encounter (Signed)
Patient in office this morning for EEG. She advised staff she has not received medication to take prior to MRI today at 11:30. Her pharmacy never got Rx.  I advised staff I had not gotten a message that she requested premed. Patient stated she mentioned this in her appointment reminder call. I had staff apologize, advised will have MD send in Rx asap this morning. She does have a driver. Requested Rx sent to [facility] on [address]. Patient verbalized understanding, appreciation.

## 2020-01-03 NOTE — Telephone Encounter (Signed)
Patient had to r/s her MRI for tomorrow because she can't have a EEG and MRI on the same day.Marland Kitchen She would like her medicine sent to [facility] at [facility].

## 2020-01-04 ENCOUNTER — Ambulatory Visit (INDEPENDENT_AMBULATORY_CARE_PROVIDER_SITE_OTHER): Payer: BC Managed Care – PPO

## 2020-01-04 ENCOUNTER — Other Ambulatory Visit: Payer: Self-pay

## 2020-01-04 DIAGNOSIS — M797 Fibromyalgia: Secondary | ICD-10-CM | POA: Diagnosis not present

## 2020-01-04 DIAGNOSIS — R4020 Unspecified coma: Secondary | ICD-10-CM

## 2020-01-04 DIAGNOSIS — F419 Anxiety disorder, unspecified: Secondary | ICD-10-CM | POA: Diagnosis not present

## 2020-01-04 DIAGNOSIS — R569 Unspecified convulsions: Secondary | ICD-10-CM | POA: Diagnosis not present

## 2020-01-04 MED ORDER — GADOBENATE DIMEGLUMINE 529 MG/ML IV SOLN
20.0000 mL | Freq: Once | INTRAVENOUS | Status: AC | PRN
Start: 2020-01-04 — End: 2020-01-04
  Administered 2020-01-04: 20 mL via INTRAVENOUS

## 2020-01-05 DIAGNOSIS — R569 Unspecified convulsions: Secondary | ICD-10-CM | POA: Diagnosis not present

## 2020-01-05 DIAGNOSIS — E162 Hypoglycemia, unspecified: Secondary | ICD-10-CM | POA: Diagnosis not present

## 2020-01-05 DIAGNOSIS — E669 Obesity, unspecified: Secondary | ICD-10-CM | POA: Diagnosis not present

## 2020-01-10 ENCOUNTER — Encounter: Payer: Self-pay | Admitting: *Deleted

## 2020-01-10 ENCOUNTER — Telehealth: Payer: Self-pay | Admitting: *Deleted

## 2020-01-12 NOTE — Procedures (Signed)
   [facility]  EEG (ELECTROENCEPHALOGRAM) REPORT   STUDY DATE: 01/03/20 PATIENT NAME: Nicole Colon DOB: 20-Nov-1980 MRN: [id number]  ORDERING CLINICIAN: Joycelyn Schmid, MD   TECHNOLOGIST: Ardis Hughs TECHNIQUE: Electroencephalogram was recorded utilizing standard 10-20 system of lead placement and reformatted into average and bipolar montages.  RECORDING TIME: 26 monutes  ACTIVATION: hyperventilation and photic stimulation  CLINICAL INFORMATION: 39 year old female with possible seizure.  FINDINGS: Posterior dominant background rhythms, which attenuate with eye opening, ranging 10-11 hertz and 30-40 microvolts. No focal, lateralizing, epileptiform activity or seizures are seen. Patient recorded in the awake and drowsy state. EKG channel shows regular rhythm of 70-80 beats per minute.   IMPRESSION:   Normal EEG in the awake and drowsy states.     INTERPRETING PHYSICIAN:  Suanne Marker, MD Certified in Neurology, Neurophysiology and Neuroimaging  [facility] [address], [address] [place], [place] [address] [phone]

## 2020-01-16 ENCOUNTER — Encounter: Payer: Self-pay | Admitting: *Deleted

## 2020-01-16 NOTE — Telephone Encounter (Signed)
Sent my chart with normal EEG results, advised she continue to follow [place] DMV law.

## 2020-02-03 DIAGNOSIS — E162 Hypoglycemia, unspecified: Secondary | ICD-10-CM | POA: Diagnosis not present

## 2020-02-03 DIAGNOSIS — E669 Obesity, unspecified: Secondary | ICD-10-CM | POA: Diagnosis not present

## 2020-02-03 DIAGNOSIS — R569 Unspecified convulsions: Secondary | ICD-10-CM | POA: Diagnosis not present

## 2020-02-03 DIAGNOSIS — D649 Anemia, unspecified: Secondary | ICD-10-CM | POA: Diagnosis not present

## 2020-03-02 DIAGNOSIS — U071 COVID-19: Secondary | ICD-10-CM | POA: Diagnosis not present

## 2020-03-12 DIAGNOSIS — M5432 Sciatica, left side: Secondary | ICD-10-CM | POA: Diagnosis not present

## 2020-05-28 ENCOUNTER — Ambulatory Visit: Payer: BC Managed Care – PPO | Admitting: Diagnostic Neuroimaging

## 2020-06-21 DIAGNOSIS — N390 Urinary tract infection, site not specified: Secondary | ICD-10-CM | POA: Diagnosis not present

## 2020-07-08 IMAGING — CT CT HEAD W/O CM
3 series · 15 of 45 positions shown, 18 images · non-contrast
Comparison: None.

CLINICAL DATA: Seizure

EXAM:
CT HEAD WITHOUT CONTRAST
TECHNIQUE: Contiguous axial images were obtained from the base of the skull
through the vertex without intravenous contrast.

[Series 3: head wo · axial · 0.47mm/px · z∈[-138,-23]mm · 9 of 28 slices shown, 12 images]
[im 3/28  brain]
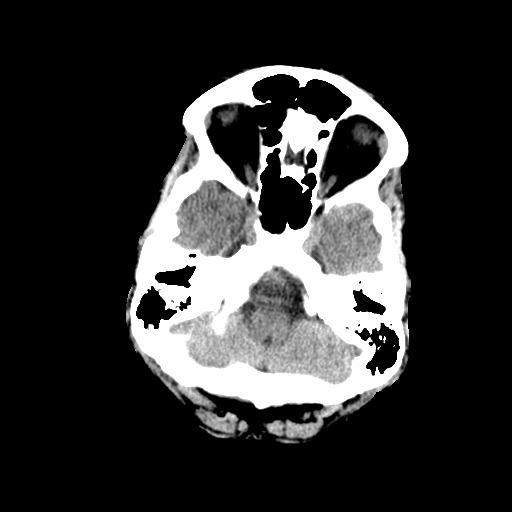
[im 3/28  bone]
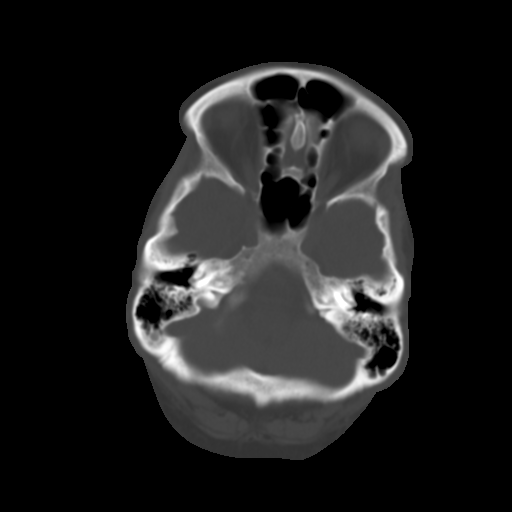
[im 6/28  brain]
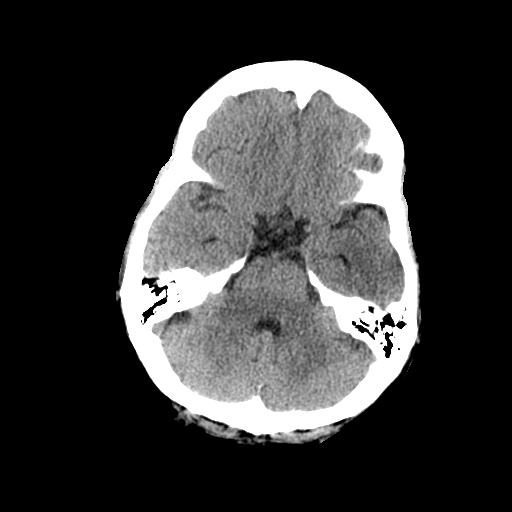
[im 9/28  brain]
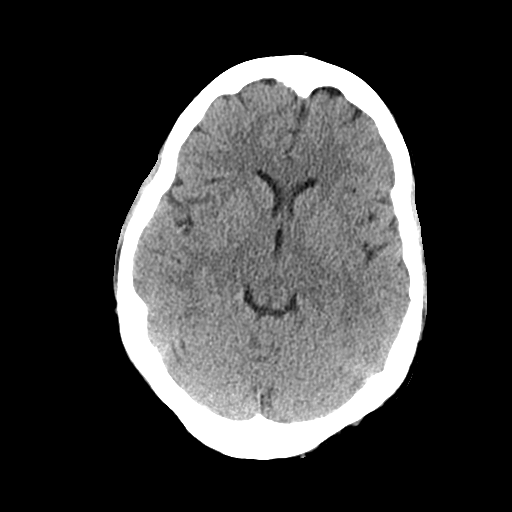
[im 12/28  brain]
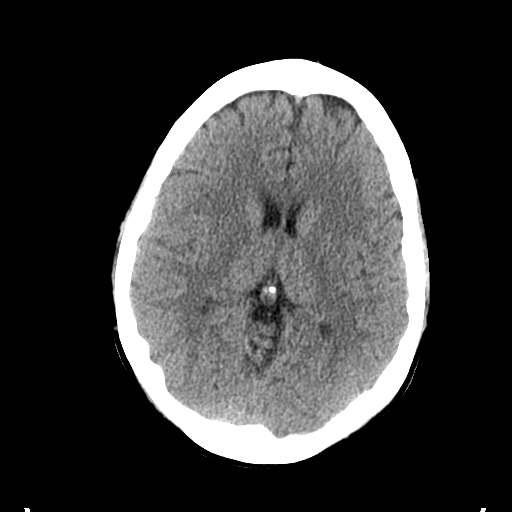
[im 15/28  brain]
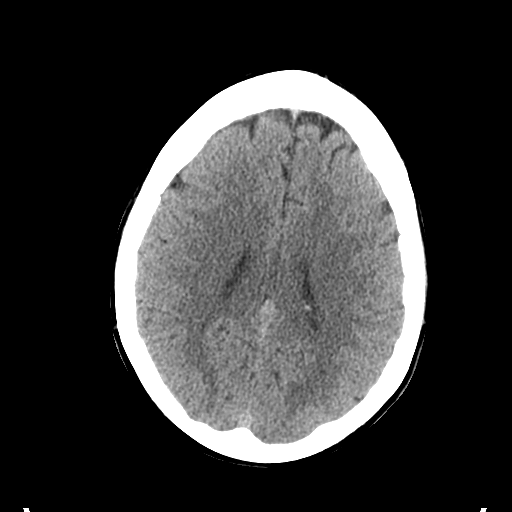
[im 15/28  bone]
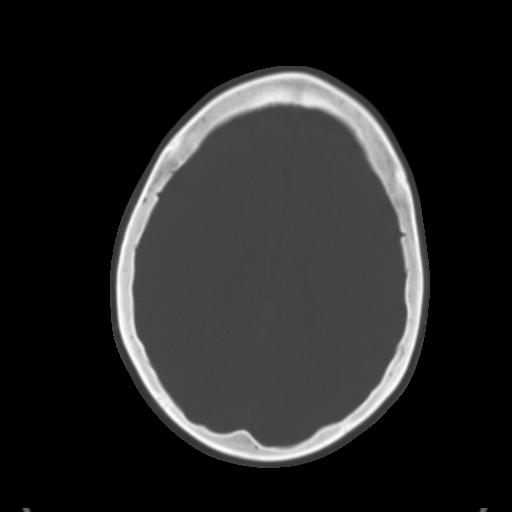
[im 17/28  brain]
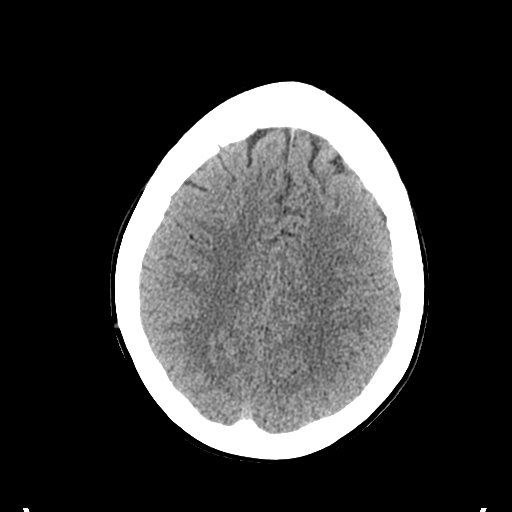
[im 20/28  brain]
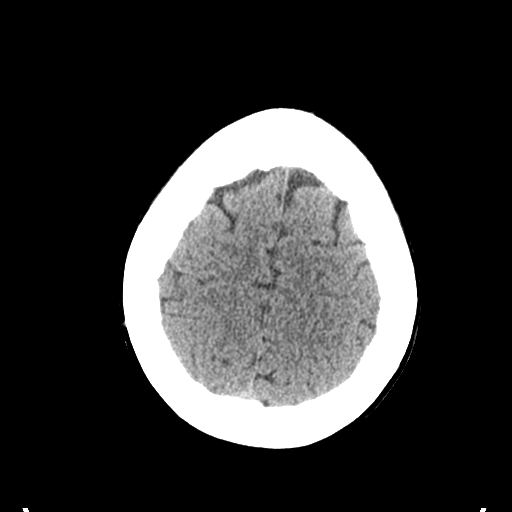
[im 23/28  brain]
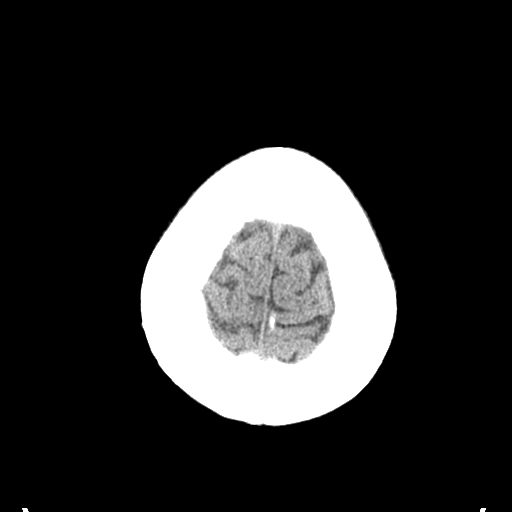
[im 26/28  brain]
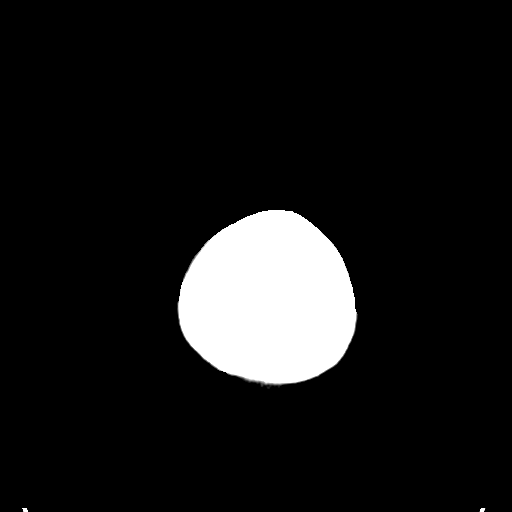
[im 26/28  bone]
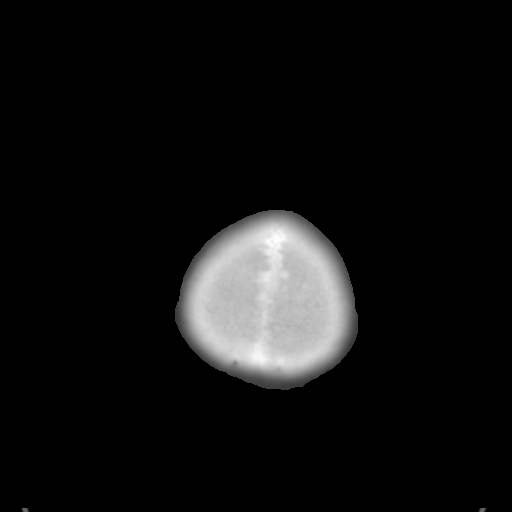

[Series 4: coronal soft tissue · coronal · 0.28mm/px · 3 of 70 slices shown]
[im 24/70  brain]
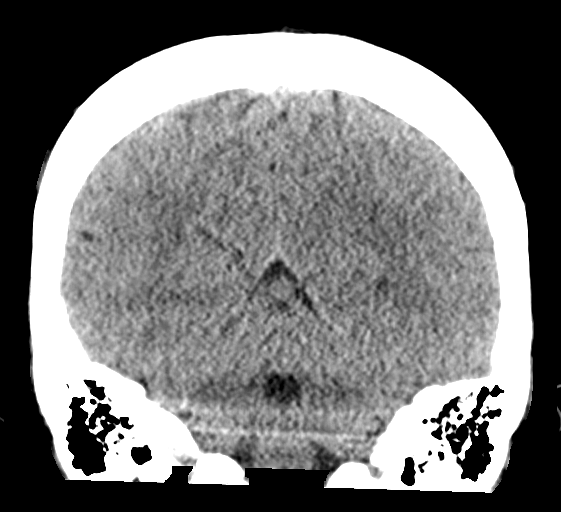
[im 31/70  brain]
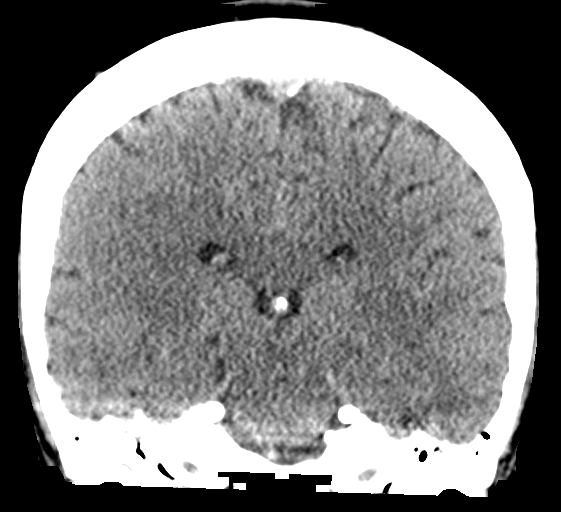
[im 39/70  brain]
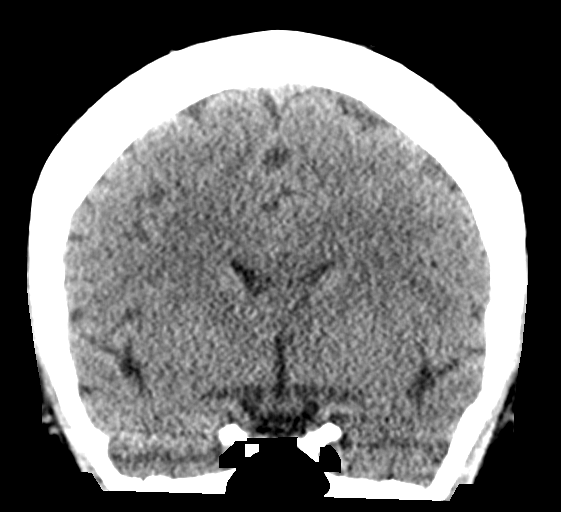

[Series 5: sagittal soft tissue · sagittal · 0.28mm/px · 3 of 54 slices shown]
[im 18/54  brain]
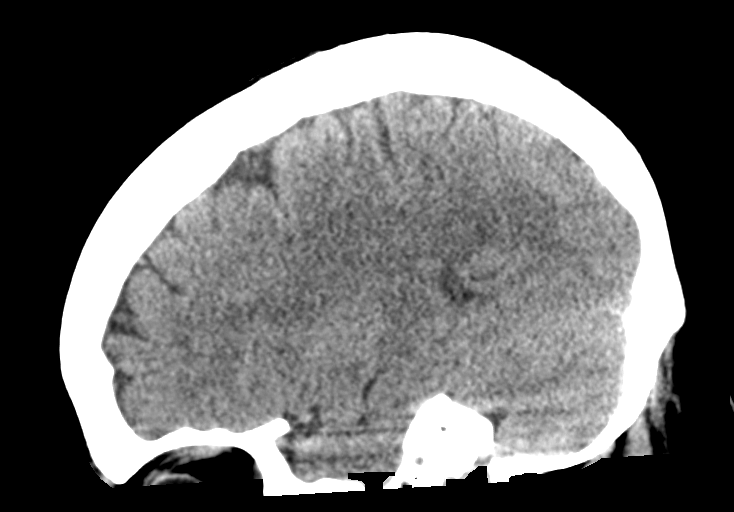
[im 27/54  brain]
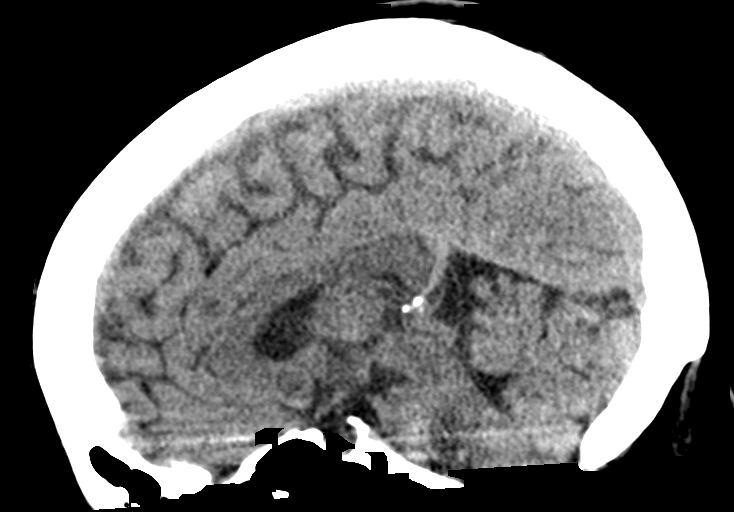
[im 36/54  brain]
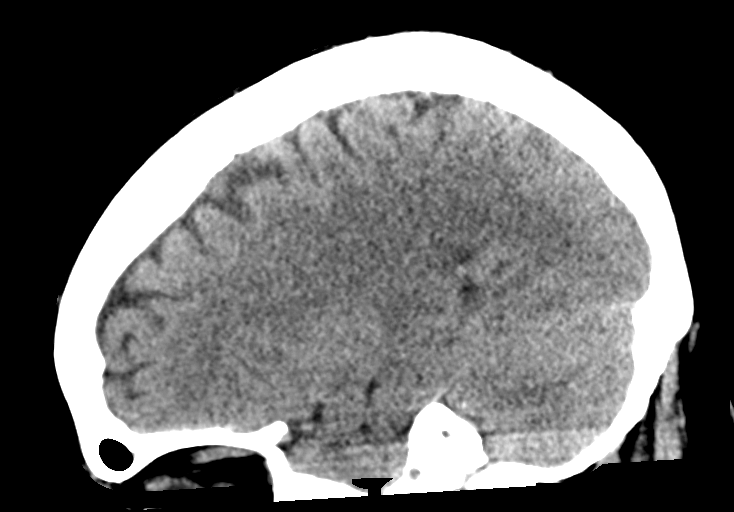

[15 of 45 positions shown; findings below may reference images not displayed]

FINDINGS: Brain: No evidence of acute infarction, hemorrhage, hydrocephalus,
extra-axial collection or mass lesion/mass effect.

Vascular: No hyperdense vessel or unexpected calcification.

Skull: Normal. Negative for fracture or focal lesion.

Sinuses/Orbits: No acute finding.

Other: None.
IMPRESSION: No acute intracranial pathology.

## 2020-07-23 DIAGNOSIS — D509 Iron deficiency anemia, unspecified: Secondary | ICD-10-CM | POA: Diagnosis not present

## 2020-07-23 DIAGNOSIS — Z7251 High risk heterosexual behavior: Secondary | ICD-10-CM | POA: Diagnosis not present

## 2020-07-23 DIAGNOSIS — R350 Frequency of micturition: Secondary | ICD-10-CM | POA: Diagnosis not present

## 2020-07-23 DIAGNOSIS — Z1331 Encounter for screening for depression: Secondary | ICD-10-CM | POA: Diagnosis not present

## 2020-07-23 DIAGNOSIS — Z79899 Other long term (current) drug therapy: Secondary | ICD-10-CM | POA: Diagnosis not present

## 2020-07-23 DIAGNOSIS — R7989 Other specified abnormal findings of blood chemistry: Secondary | ICD-10-CM | POA: Diagnosis not present

## 2020-07-23 DIAGNOSIS — F909 Attention-deficit hyperactivity disorder, unspecified type: Secondary | ICD-10-CM | POA: Diagnosis not present

## 2020-09-17 DIAGNOSIS — Z6841 Body Mass Index (BMI) 40.0 and over, adult: Secondary | ICD-10-CM | POA: Diagnosis not present

## 2020-09-17 DIAGNOSIS — J0110 Acute frontal sinusitis, unspecified: Secondary | ICD-10-CM | POA: Diagnosis not present

## 2020-09-17 DIAGNOSIS — R52 Pain, unspecified: Secondary | ICD-10-CM | POA: Diagnosis not present

## 2020-10-02 DIAGNOSIS — F419 Anxiety disorder, unspecified: Secondary | ICD-10-CM | POA: Diagnosis not present

## 2020-10-02 DIAGNOSIS — F909 Attention-deficit hyperactivity disorder, unspecified type: Secondary | ICD-10-CM | POA: Diagnosis not present

## 2020-10-02 DIAGNOSIS — G43909 Migraine, unspecified, not intractable, without status migrainosus: Secondary | ICD-10-CM | POA: Diagnosis not present

## 2020-10-02 DIAGNOSIS — Z79899 Other long term (current) drug therapy: Secondary | ICD-10-CM | POA: Diagnosis not present

## 2020-10-02 DIAGNOSIS — M797 Fibromyalgia: Secondary | ICD-10-CM | POA: Diagnosis not present

## 2020-10-24 DIAGNOSIS — M5442 Lumbago with sciatica, left side: Secondary | ICD-10-CM | POA: Diagnosis not present

## 2020-10-24 DIAGNOSIS — Z6841 Body Mass Index (BMI) 40.0 and over, adult: Secondary | ICD-10-CM | POA: Diagnosis not present

## 2021-01-02 DIAGNOSIS — Z79899 Other long term (current) drug therapy: Secondary | ICD-10-CM | POA: Diagnosis not present

## 2021-01-02 DIAGNOSIS — F988 Other specified behavioral and emotional disorders with onset usually occurring in childhood and adolescence: Secondary | ICD-10-CM | POA: Diagnosis not present

## 2021-01-02 DIAGNOSIS — M797 Fibromyalgia: Secondary | ICD-10-CM | POA: Diagnosis not present

## 2021-01-02 DIAGNOSIS — F419 Anxiety disorder, unspecified: Secondary | ICD-10-CM | POA: Diagnosis not present

## 2021-05-16 DIAGNOSIS — M797 Fibromyalgia: Secondary | ICD-10-CM | POA: Diagnosis not present

## 2021-05-16 DIAGNOSIS — D509 Iron deficiency anemia, unspecified: Secondary | ICD-10-CM | POA: Diagnosis not present

## 2021-05-16 DIAGNOSIS — F988 Other specified behavioral and emotional disorders with onset usually occurring in childhood and adolescence: Secondary | ICD-10-CM | POA: Diagnosis not present

## 2021-05-16 DIAGNOSIS — R6889 Other general symptoms and signs: Secondary | ICD-10-CM | POA: Diagnosis not present

## 2021-07-18 DIAGNOSIS — R0789 Other chest pain: Secondary | ICD-10-CM | POA: Diagnosis not present

## 2021-07-18 DIAGNOSIS — R070 Pain in throat: Secondary | ICD-10-CM | POA: Diagnosis not present

## 2021-07-18 DIAGNOSIS — Z6841 Body Mass Index (BMI) 40.0 and over, adult: Secondary | ICD-10-CM | POA: Diagnosis not present

## 2021-07-18 DIAGNOSIS — J020 Streptococcal pharyngitis: Secondary | ICD-10-CM | POA: Diagnosis not present

## 2021-07-19 DIAGNOSIS — M549 Dorsalgia, unspecified: Secondary | ICD-10-CM | POA: Diagnosis not present

## 2021-07-29 DIAGNOSIS — M5136 Other intervertebral disc degeneration, lumbar region: Secondary | ICD-10-CM | POA: Diagnosis not present

## 2021-07-29 DIAGNOSIS — M4317 Spondylolisthesis, lumbosacral region: Secondary | ICD-10-CM | POA: Diagnosis not present

## 2021-07-29 DIAGNOSIS — M545 Low back pain, unspecified: Secondary | ICD-10-CM | POA: Diagnosis not present

## 2021-08-10 DIAGNOSIS — S0990XA Unspecified injury of head, initial encounter: Secondary | ICD-10-CM | POA: Diagnosis not present

## 2021-08-10 DIAGNOSIS — R519 Headache, unspecified: Secondary | ICD-10-CM | POA: Diagnosis not present

## 2021-08-10 DIAGNOSIS — M797 Fibromyalgia: Secondary | ICD-10-CM | POA: Diagnosis not present

## 2021-08-10 DIAGNOSIS — Z043 Encounter for examination and observation following other accident: Secondary | ICD-10-CM | POA: Diagnosis not present

## 2021-08-10 DIAGNOSIS — R55 Syncope and collapse: Secondary | ICD-10-CM | POA: Diagnosis not present

## 2021-08-10 DIAGNOSIS — M4317 Spondylolisthesis, lumbosacral region: Secondary | ICD-10-CM | POA: Diagnosis not present

## 2021-08-10 DIAGNOSIS — R000 Tachycardia, unspecified: Secondary | ICD-10-CM | POA: Diagnosis not present

## 2021-08-10 DIAGNOSIS — M47816 Spondylosis without myelopathy or radiculopathy, lumbar region: Secondary | ICD-10-CM | POA: Diagnosis not present

## 2021-08-10 DIAGNOSIS — S0003XA Contusion of scalp, initial encounter: Secondary | ICD-10-CM | POA: Diagnosis not present

## 2021-08-10 DIAGNOSIS — Z79899 Other long term (current) drug therapy: Secondary | ICD-10-CM | POA: Diagnosis not present

## 2021-08-10 DIAGNOSIS — M545 Low back pain, unspecified: Secondary | ICD-10-CM | POA: Diagnosis not present

## 2021-08-10 DIAGNOSIS — W19XXXA Unspecified fall, initial encounter: Secondary | ICD-10-CM | POA: Diagnosis not present
